# Patient Record
Sex: Male | Born: 1980 | Race: White | Hispanic: No | Marital: Married | Smoking: Current every day smoker
Health system: Southern US, Community
[De-identification: ages and names within clinical notes are randomized; demographics above are authoritative.]

## PROBLEM LIST (undated history)

## (undated) DIAGNOSIS — J449 Chronic obstructive pulmonary disease, unspecified: Secondary | ICD-10-CM

## (undated) DIAGNOSIS — K3184 Gastroparesis: Secondary | ICD-10-CM

## (undated) DIAGNOSIS — I10 Essential (primary) hypertension: Secondary | ICD-10-CM

## (undated) DIAGNOSIS — E119 Type 2 diabetes mellitus without complications: Secondary | ICD-10-CM

---

## 2007-11-09 ENCOUNTER — Emergency Department (HOSPITAL_COMMUNITY): Admission: EM | Admit: 2007-11-09 | Discharge: 2007-11-09 | Payer: Self-pay | Admitting: Emergency Medicine

## 2008-07-03 ENCOUNTER — Emergency Department (HOSPITAL_COMMUNITY): Admission: EM | Admit: 2008-07-03 | Discharge: 2008-07-03 | Payer: Self-pay | Admitting: Emergency Medicine

## 2008-08-14 ENCOUNTER — Inpatient Hospital Stay (HOSPITAL_COMMUNITY): Admission: EM | Admit: 2008-08-14 | Discharge: 2008-08-17 | Payer: Self-pay | Admitting: Emergency Medicine

## 2011-03-11 NOTE — Consult Note (Signed)
Marco Beasley, [place] NO.:  [id number]   MEDICAL RECORD NO.:  [id number]          PATIENT TYPE:  INP   LOCATION:  5501                         FACILITY:  [facility]   PHYSICIAN:  Talmage Coin, M.D.     DATE OF BIRTH:  [birth year]   DATE OF CONSULTATION:  08/16/2008  DATE OF DISCHARGE:                                 CONSULTATION   CHIEF COMPLAINT:  Fluctuating blood sugar.   HISTORY OF PRESENT ILLNESS:  Mr. Marco Beasley is a 30-year-of-age gentleman  with type 1 diabetes mellitus since age 30.  He notes that his mother  provided support.  He realizes that she provided more support than he  ever imagined.  Unfortunately, she died 4 years ago.  He struggled to  maintain glycemic control for the past 3 years.  His complications  included distal peripheral neuropathy, erectile dysfunction, but unknown  burden of retinopathy.  He is motivated to control his diabetes now for  the sake of a functional life and for the sake of his 3 sons.  At home,  he has been taking Lantus 45 units subcutaneous at bedtime.  He had also  been on either NovoLog or actually using Humalog Mix 75/25 at meal  times.  He reports of carb ratio of 1 unit per 7 g, but subtracting 2  units for meal times.  No correction boluses.  He notes blood glucose  values that are often low around 3 a.m. at least 3 times per week on the  current regimen.  He notes that his labile glycemic control does not  have any clear precipitating causes at times.  He feels that 6 units for  60 g at meal times is usually sufficient if his blood glucose is  appropriate before the meal.  However, if he has an elevated blood  glucose with 6 units per 60 g, is insufficient.   PAST MEDICAL HISTORY:  Type 1 diabetes mellitus, erectile dysfunction,  and diabetic distal peripheral neuropathy.   ALLERGIES:  No known drug allergies.   MEDICATIONS:  Medication list reviewed in detail.   SOCIAL HISTORY:  Mother died 4 years ago.  The  patient uses tobacco.   FAMILY MEDICAL HISTORY:  Type 1 diabetes mellitus.   REVIEW OF SYSTEMS:  The patient denies recent weight loss or weight  gain.  At the time of his diagnosis, weight 245 pounds and had a blood  glucose as high as 1800 mg/dL.  He has experienced diabetic ketoacidosis  in the past.  He has maintained a stable weight of 195 pounds recently.  He denies nausea, vomiting, constipation, or diarrhea.  He has no  chronic numbness and tingling in a stocking and glove distribution.  He  denies acute vision change.  He reports hypoglycemic awareness when  blood glucose is below 60 mg/dL.  He denies foot ulcers, rashes or skin  lesions.   PHYSICAL EXAMINATION:  VITAL SIGNS:  Temperature 97.1, blood pressure  103/63, pulse 56 and regular, respiratory 20, weight 65.3 kilograms, and  height 68 inches.  GENERAL:  In no apparent distress.  Well-developed and well-nourished  gentleman.  EYES:  Extraocular movements intact and anicteric.  LYMPH:  No cervical adenopathy.  NECK:  Thyroid mobile, symmetric, and nontender.  No thyromegaly or  discrete nodule.  RESPIRATORY:  Clear, symmetric, and unlabored.  No pallor or cyanosis.  CARDIOVASCULAR:  Regular rhythm and rate.  Symmetric pedal pulses.  No  edema.  GASTROINTESTINAL:  Active bowel sounds.  Soft, nontender, and  nondistended.  No abnormal masses or hepatomegaly.  SKIN:  Appropriately warm and dry.  No jaundice, foot ulcers, or  lacerations between the toe.  He has a healing wound on the dorsum of  his foot.  No evidence of infection, however.  MUSCULOSKELETAL:  No atrophy or deformity.  He does have lipohypertrophy  on the abdominal wall at insulin injection site.  No lipohypertrophy in  the injection sites on the arms.  NEUROLOGIC:  Diminished sensation in the feet.  No tremor.   LABORATORY DATA:  On August 13, 2008, urinalysis negative for ketones,  pH 7.38, and PCO2 50.  Sodium 131, potassium 5.0, bicarbonate 27,   chloride 96, BUN 13, creatinine 1.0, glucose 618, and calcium 9.2.  On  August 15, 2008, sodium 140, potassium 4.0, bicarbonate 27, chloride  109, BUN 9, creatinine 0.7, glucose 328, and calcium 8.6.  White blood  count 5.1, hemoglobin 12.5, MCV 94, platelets 134, and hemoglobin A1c  8.9% estimating a mean glucose of 209.  TSH 1.24 microunits per  milliliter on August 14, 2008.  Glycemic records reviewed in detail.  At supper time on August 14, 2008, he received NovoLog 10 units when  glucose 129, subsequently the glucose was low at 64 mg/dL.  When glucose  27 mg/dL at bedtime, he received Lantus 40 units.  Overnight, his blood  glucose raised to 231 by records.  He received NovoLog 8 units, but then  his blood glucose declined to 46 mg/dL by lunch time.  On August 15, 2008, at supper time, glucose 242.  He received NovoLog 6 units and  remained elevated at 266 mg/dL at bedtime, he received NovoLog 3 units  and Lantus 40 units.  His blood glucose declined overnight to 79 mg/dL  upon awakening and actually as low as 51 mg/dL for breakfast.  Today at  lunch time, blood glucose had raised again to 141 mg/dL, and he received  NovoLog 3 units at lunch time.   ASSESSMENT AND PLAN:  1. Type 1 diabetes mellitus, uncontrolled.  2. Diabetic distal peripheral neuropathy.  3. Tobacco use.   Smoking cessation advised.  The patient has type 1 diabetes mellitus for  the past 14 years.  He reports better glycemic control when he had  support from his mother.  Unfortunately, his mother has subsequently  died.  As many young adults, he struggles to maintain appropriate  glycemic control without the support of family.  At present, his Lantus  dose is rather generous resulting in overnight hypoglycemia as evidence  when he also had a NovoLog dose at bedtime for correction of  hypoglycemia.  Generally speaking, most patients need 50% of insulin as  basal such as Lantus or Levemir and 50% as bolus such  as NovoLog,  Humalog, or Apidra.  The patient is informed that Humalog Mix 75/25  provides only 25% rapid acting insulin, 75% intermediate-acting insulin.  This will not be an appropriate mealtime bolus when he is taking Lantus  insulin once a day.  Therefore, I encouraged him to resume using his  NovoLog at home and  his Lantus.  Based upon his report, it seems that  one unit of rapid-acting insulin is appropriate for 10 g of  carbohydrates at meal times.  However, I would not recommend mealtime  bolus when he takes a bedtime snack.  This might result in overnight  hypoglycemia.  We reviewed how NovoLog should be taken at beginning of  the meal rather than 30 minutes before or after the meal.  Also we had  estimated that his NovoLog requirements for every 50 mg/dL above the  target of 161 mg/dL.  It is difficult to determine the Lantus dose at  this time.  It may be reasonable to start the Lantus 30 units at  bedtime, which is 33% reduction from his current home dose.  Based upon  his mealtime requirements, he may need even less than 30 units of Lantus  at bedtime.  Another option would be to take the Lantus in the morning,  so that if he does have a peak in Lantus a few hours after taking the  dose, it will occur during the day when he can better monitor and manage  hypoglycemia rather than overnight.  He may also benefit from splitting  the Lantus with a twice daily dosing such as 10-15 units twice a day  rather than 20-30 units at one time.  I have discussed the case with the  attending hospitalist.  Her plan is to keep the patient at least  overnight  for further monitoring and adjustments of his insulin regimen.  I will  follow along in hospital stay.  Also asked the patient to schedule a  visit at [facility] on Friday August 18, 2008,  for an outpatient visit with me.      Talmage Coin, M.D.  Electronically Signed     JK/MEDQ  D:  08/16/2008  T:   08/17/2008  Job:  [id number]

## 2011-03-11 NOTE — Discharge Summary (Signed)
Marco Beasley, Marco Beasley            ACCOUNT NO.:  [id number]   MEDICAL RECORD NO.:  [id number]          PATIENT TYPE:  INP   LOCATION:  5501                         FACILITY:  [facility]   PHYSICIAN:  Theodosia Paling, MD    DATE OF BIRTH:  03-Jun-1981   DATE OF ADMISSION:  08/13/2008  DATE OF DISCHARGE:  08/17/2008                               DISCHARGE SUMMARY   PRIMARY CARE PHYSICIAN:  The patient does not have a PCP at this time.   ADMITTING HISTORY:  Please refer to Dr. Shon Baton note dated August 13, 2008, under the history of present illness.   DISCHARGE DIAGNOSES:  1. Severe hyperglycemia secondary to diabetes mellitus.  2. Nitrofurantoin.  3. Hypertension.  4. Diabetic neuropathy.   DISCHARGE MEDICATIONS:  1. Aspirin enteric-coated 81 mg p.o. daily.  2. Omeprazole 20 mg p.o. q.12 h.  3. Neurontin 300 mg p.o. nightly.  4. Lantus insulin 20 units subcu nightly.  5. NovoLog 1 unit per 10 g of carbohydrate at meal time, but not at      bedtime.  6. NovoLog 1 unit per 50 g/dL of blood glucose above 100 mg/dL at meal      time, but not at bedtime.  7. Glucagon Emergency Kit as needed per instructions.  8. Oxycodone 5 mg p.o. q.4 h. p.r.n. for neuropathic pain.   HOSPITAL COURSE:  Following issues were addressed during the  hospitalization.  1. Hyperglycemia.  The patient received insulin. The patient      demonstrated very labile blood glucose control, therefore,      consultation with Endocrinology consult was obtained.  The patient      will be following up with him within 1 week's time and will call      him in case he has any questions meanwhile.  At the time of      discharge, he is relatively euglycemic without any incidence of      hypoglycemia for the last 20 hours.  The patient will be using      Lantus 20 units subcu nightly as a long-acting insulin, and he will      be using NovoLog 1 unit/10 g of carbohydrate at meal time and 1      unit per 50 mg/dL of blood  glucose above 100 at meal time, but not      at bedtime.  2. Diabetic neuropathy.  The patient was requesting for IV Dilaudid      for pain control.  At the time of discharge, he was going on p.o.      oxycodone, he says he gets leg swelling with Lyrica, labile mood      with Cymbalta.  He will be following up with Dr. Sharl Ma for diabetic      neuropathy management as well.  3. Hypertension.  The patient at the time of presentation was      hypertensive, however, without any antihypertensive, blood pressure      normalized to 103/63 at the time of discharge.  4. Retinopathy.  The patient will follow up as an outpatient with the  ophthalmologist.   DISPOSITION:  The patient will follow up with Dr. Sharl Ma in his office  within 1 week's time.   Total time spent in discharge of this patient 45 minutes.   Consultation obtained Dr. Sharl Ma from [facility].   PROCEDURE PERFORMED:  None.   IMAGING PERFORMED:  None.      Theodosia Paling, MD  Electronically Signed     NP/MEDQ  D:  08/17/2008  T:  08/18/2008  Job:  [id number]

## 2011-03-11 NOTE — H&P (Signed)
NAME:  Marco Beasley, Marco Beasley            ACCOUNT NO.:  [id number]   MEDICAL RECORD NO.:  [id number]          PATIENT TYPE:  INP   LOCATION:  1832                         FACILITY:  [facility]   PHYSICIAN:  Lonia Blood, M.D.      DATE OF BIRTH:  05-15-1981   DATE OF ADMISSION:  08/13/2008  DATE OF DISCHARGE:                              HISTORY & PHYSICAL   PRIMARY CARE PHYSICIAN:  The patient is unassigned.   PRESENTING COMPLAINT:  High blood sugar.   HISTORY OF PRESENT ILLNESS:  The patient is a 30 year old type 1  diabetic, who has been following with his physician at [facility]  all his life.  The patient has had problems with his blood sugar  especially in the past 3 years.  His sugar has been consistently at  least 250.  He has had multiple admissions at [facility] for  sugar control.  Per patient, the last admission was last week, when he  was seen and told that he needed to come to [facility] because there is  no endocrinologist at the [facility].  The patient apparently has had  multiple DKAs in the past.  He has had problem with subcutaneous  insulin.  He always get his sugar dropped to at least 200 in the  hospital and sent home, but his Lantus and Humalog have not been able to  control his sugars at home.  He has had significant neuropathy and  retinopathy and continuous pain.   PAST MEDICAL HISTORY:  1. Diabetes type 1.  2. Peripheral neuropathy.  3. Diabetic retinopathy.   ALLERGIES:  HYDROCODONE, LEVAQUIN, and MORPHINE.   MEDICATIONS:  1. Lantus 45 units at night.  2. Humalog 75/25.  3. NovoLog sliding scale insulin.  4. Neurontin.  5. Protonix.   SOCIAL HISTORY:  The patient is married, lives in [place].  He smokes  about half a pack per day.  He is under tremendous stress due to custody  battle.  He has had a history of narcotic abuse in the past.  He denied  any alcohol use.   FAMILY HISTORY:  Mainly diabetes.   REVIEW OF SYSTEMS:  A 12-point review  of systems is negative except per  HPI.  The patient mainly complains of generalized pain from his  neuropathy.   PHYSICAL EXAMINATION:  VITAL SIGNS:  Temperature is 98, blood pressure  154/75, his pulse is 72, respiratory rate 19, and sats 98% on room air.  GENERAL:  The patient looks chronically ill-looking, but in no acute  distress.  HEENT:  PERRL.  EOMI.  NECK:  Supple.  No JVD.  No lymphadenopathy.  RESPIRATORY:  He has good air entry bilaterally.  No wheezes or rales.  CARDIOVASCULAR:  The patient has S1 and S2.  No murmurs.  ABDOMEN:  Soft and nontender with positive bowel sounds.  EXTREMITIES:  No edema, cyanosis, or clubbing.   LABORATORY DATA:  White count is 5.8, hemoglobin 14.6, platelet count  167.  Urinalysis negative except for glycosuria.  Sodium is 131,  potassium 5.0, chloride 96, BUN 13, creatinine 1.0, glucose 618, and  ionized calcium 1.15.  Urine drug screen is negative.   ASSESSMENT:  This is a 30 year old gentleman presenting with severe  hyperglycemia.  The patient has had multiple admissions.  His sugar is  not controlled at home.  He will probably benefit from further admission  and Endocrinology followup.   PLAN:  1. Severe hyperglycemia.  We will admit him to step-down unit.  Start      IV insulin and normal saline.  Continue to adjust his insulin as      necessary.  Once his sugars stabilized around 250, we will get the      patient back on subcutaneous insulin and titrate it as necessary.  2. Peripheral neuropathy.  We will continue with his Neurontin and      some pain control.  3. Hypertension.  The patient is not on any anti-hypertensive at home.      We will endeavor to give him that while in the hospital.  4. Diabetic retinopathy.  Again, this is new for the patient and he      has an ophthalmologist that he follows up with at home.  Otherwise      further treatment will depend on how he does in hospital.      Lonia Blood, M.D.   Electronically Signed     LG/MEDQ  D:  08/14/2008  T:  08/14/2008  Job:  [id number]

## 2011-07-17 LAB — I-STAT 8, (EC8 V) (CONVERTED LAB)
BUN: 10
Bicarbonate: 31 — ABNORMAL HIGH
Hemoglobin: 15.3
Operator id: 198171
pCO2, Ven: 58.2 — ABNORMAL HIGH
pH, Ven: 7.335 — ABNORMAL HIGH

## 2011-07-17 LAB — DIFFERENTIAL
Basophils Absolute: 0
Lymphocytes Relative: 17
Monocytes Absolute: 0.5
Monocytes Relative: 6
Neutro Abs: 6.3

## 2011-07-17 LAB — BASIC METABOLIC PANEL
Calcium: 9.1
GFR calc Af Amer: >60
GFR calc non Af Amer: >60
Sodium: 141

## 2011-07-17 LAB — CBC
Hemoglobin: 14.6
RBC: 4.61

## 2011-07-17 LAB — RAPID URINE DRUG SCREEN, HOSP PERFORMED
Benzodiazepines: NOT DETECTED
Cocaine: NOT DETECTED
Opiates: POSITIVE — AB
Tetrahydrocannabinol: POSITIVE — AB

## 2011-07-17 LAB — ETHANOL: Alcohol, Ethyl (B): <5

## 2011-07-28 LAB — POCT I-STAT, CHEM 8
Glucose, Bld: 618
HCT: 42
Hemoglobin: 14.3
Potassium: 5

## 2011-07-28 LAB — URINALYSIS, ROUTINE W REFLEX MICROSCOPIC
Bilirubin Urine: NEGATIVE
Hgb urine dipstick: NEGATIVE
Protein, ur: NEGATIVE
Urobilinogen, UA: 0.2

## 2011-07-28 LAB — CBC
HCT: 39.5
HCT: 43.5
Hemoglobin: 12.5 — ABNORMAL LOW
Hemoglobin: 13.4
Hemoglobin: 14.6
MCHC: 34
MCHC: 34
Platelets: 167
Platelets: 169
RBC: 3.89 — ABNORMAL LOW
RBC: 4.53
RDW: 12.8
WBC: 5.1
WBC: 5.8

## 2011-07-28 LAB — GLUCOSE, CAPILLARY
Glucose-Capillary: 104 — ABNORMAL HIGH
Glucose-Capillary: 114 — ABNORMAL HIGH
Glucose-Capillary: 116 — ABNORMAL HIGH
Glucose-Capillary: 120 — ABNORMAL HIGH
Glucose-Capillary: 122 — ABNORMAL HIGH
Glucose-Capillary: 137 — ABNORMAL HIGH
Glucose-Capillary: 152 — ABNORMAL HIGH
Glucose-Capillary: 169 — ABNORMAL HIGH
Glucose-Capillary: 242 — ABNORMAL HIGH
Glucose-Capillary: 266 — ABNORMAL HIGH
Glucose-Capillary: 323 — ABNORMAL HIGH
Glucose-Capillary: 46 — ABNORMAL LOW
Glucose-Capillary: 497 — ABNORMAL HIGH
Glucose-Capillary: 51 — ABNORMAL LOW
Glucose-Capillary: 54 — ABNORMAL LOW
Glucose-Capillary: 56 — ABNORMAL LOW
Glucose-Capillary: 79
Glucose-Capillary: 85
Glucose-Capillary: 85

## 2011-07-28 LAB — BASIC METABOLIC PANEL
BUN: 12
CO2: 27
CO2: 34 — ABNORMAL HIGH
Calcium: 8.6
Calcium: 9.1
GFR calc Af Amer: >60
GFR calc Af Amer: >60
GFR calc non Af Amer: >60
GFR calc non Af Amer: >60
GFR calc non Af Amer: >60
Glucose, Bld: 122 — ABNORMAL HIGH
Potassium: 4.9
Sodium: 128 — ABNORMAL LOW
Sodium: 140

## 2011-07-28 LAB — POCT I-STAT 3, VENOUS BLOOD GAS (G3P V)
Acid-Base Excess: 4 — ABNORMAL HIGH
pO2, Ven: 29 — CL

## 2011-07-28 LAB — DIFFERENTIAL
Eosinophils Relative: 4
Lymphocytes Relative: 26
Lymphs Abs: 1.5
Monocytes Absolute: 0.3
Monocytes Relative: 6

## 2011-07-28 LAB — COMPREHENSIVE METABOLIC PANEL
Albumin: 3.9
BUN: 11
Calcium: 8.9
Creatinine, Ser: 0.75
Potassium: 3.8
Total Protein: 6

## 2011-07-28 LAB — RAPID URINE DRUG SCREEN, HOSP PERFORMED
Amphetamines: NOT DETECTED
Barbiturates: NOT DETECTED
Benzodiazepines: NOT DETECTED

## 2011-07-28 LAB — MAGNESIUM: Magnesium: 2

## 2011-07-28 LAB — HEMOGLOBIN A1C
Mean Plasma Glucose: 203
Mean Plasma Glucose: 209

## 2011-07-28 LAB — CULTURE, BLOOD (ROUTINE X 2)

## 2011-07-28 LAB — CALCIUM: Calcium: 8.3 — ABNORMAL LOW

## 2011-07-28 LAB — LIPID PANEL
HDL: 34 — ABNORMAL LOW
VLDL: 9

## 2011-07-28 LAB — TSH: TSH: 1.246

## 2015-08-07 ENCOUNTER — Emergency Department (HOSPITAL_COMMUNITY): Payer: Self-pay

## 2015-08-07 ENCOUNTER — Inpatient Hospital Stay (HOSPITAL_COMMUNITY)
Admission: EM | Admit: 2015-08-07 | Discharge: 2015-08-11 | DRG: 638 | Disposition: A | Discharge status: Home / Self Care | Payer: Self-pay | Attending: Internal Medicine | Admitting: Internal Medicine

## 2015-08-07 ENCOUNTER — Encounter (HOSPITAL_COMMUNITY): Payer: Self-pay | Admitting: Emergency Medicine

## 2015-08-07 DIAGNOSIS — E1043 Type 1 diabetes mellitus with diabetic autonomic (poly)neuropathy: Secondary | ICD-10-CM | POA: Diagnosis present

## 2015-08-07 DIAGNOSIS — E871 Hypo-osmolality and hyponatremia: Secondary | ICD-10-CM | POA: Diagnosis present

## 2015-08-07 DIAGNOSIS — E860 Dehydration: Secondary | ICD-10-CM | POA: Diagnosis present

## 2015-08-07 DIAGNOSIS — R7401 Elevation of levels of liver transaminase levels: Secondary | ICD-10-CM

## 2015-08-07 DIAGNOSIS — Z880 Allergy status to penicillin: Secondary | ICD-10-CM

## 2015-08-07 DIAGNOSIS — Z22322 Carrier or suspected carrier of Methicillin resistant Staphylococcus aureus: Secondary | ICD-10-CM

## 2015-08-07 DIAGNOSIS — R740 Nonspecific elevation of levels of transaminase and lactic acid dehydrogenase [LDH]: Secondary | ICD-10-CM | POA: Diagnosis present

## 2015-08-07 DIAGNOSIS — R748 Abnormal levels of other serum enzymes: Secondary | ICD-10-CM | POA: Insufficient documentation

## 2015-08-07 DIAGNOSIS — J449 Chronic obstructive pulmonary disease, unspecified: Secondary | ICD-10-CM | POA: Diagnosis present

## 2015-08-07 DIAGNOSIS — K3184 Gastroparesis: Secondary | ICD-10-CM | POA: Diagnosis present

## 2015-08-07 DIAGNOSIS — Z833 Family history of diabetes mellitus: Secondary | ICD-10-CM

## 2015-08-07 DIAGNOSIS — K838 Other specified diseases of biliary tract: Secondary | ICD-10-CM

## 2015-08-07 DIAGNOSIS — F17200 Nicotine dependence, unspecified, uncomplicated: Secondary | ICD-10-CM | POA: Diagnosis present

## 2015-08-07 DIAGNOSIS — E1010 Type 1 diabetes mellitus with ketoacidosis without coma: Principal | ICD-10-CM | POA: Diagnosis present

## 2015-08-07 DIAGNOSIS — E1110 Type 2 diabetes mellitus with ketoacidosis without coma: Secondary | ICD-10-CM | POA: Diagnosis present

## 2015-08-07 DIAGNOSIS — I10 Essential (primary) hypertension: Secondary | ICD-10-CM | POA: Diagnosis present

## 2015-08-07 DIAGNOSIS — N179 Acute kidney failure, unspecified: Secondary | ICD-10-CM | POA: Diagnosis present

## 2015-08-07 HISTORY — DX: Essential (primary) hypertension: I10

## 2015-08-07 HISTORY — DX: Gastroparesis: K31.84

## 2015-08-07 HISTORY — DX: Chronic obstructive pulmonary disease, unspecified: J44.9

## 2015-08-07 HISTORY — DX: Type 2 diabetes mellitus without complications: E11.9

## 2015-08-07 LAB — URINALYSIS, ROUTINE W REFLEX MICROSCOPIC
Bilirubin Urine: NEGATIVE
Glucose, UA: >1000 mg/dL — AB
Hgb urine dipstick: NEGATIVE
KETONES UR: 40 mg/dL — AB
LEUKOCYTES UA: NEGATIVE
NITRITE: NEGATIVE
PH: 6.5 (ref 5.0–8.0)
Protein, ur: NEGATIVE mg/dL
Specific Gravity, Urine: 1.021 (ref 1.005–1.030)
Urobilinogen, UA: 0.2 mg/dL (ref 0.0–1.0)

## 2015-08-07 LAB — CBC
HCT: 41.3 % (ref 39.0–52.0)
Hemoglobin: 13.9 g/dL (ref 13.0–17.0)
MCH: 31.2 pg (ref 26.0–34.0)
MCHC: 33.7 g/dL (ref 30.0–36.0)
MCV: 92.8 fL (ref 78.0–100.0)
PLATELETS: 177 10*3/uL (ref 150–400)
RBC: 4.45 MIL/uL (ref 4.22–5.81)
RDW: 12.3 % (ref 11.5–15.5)
WBC: 4.4 10*3/uL (ref 4.0–10.5)

## 2015-08-07 LAB — I-STAT TROPONIN, ED: TROPONIN I, POC: 0 ng/mL (ref 0.00–0.08)

## 2015-08-07 LAB — URINE MICROSCOPIC-ADD ON

## 2015-08-07 LAB — COMPREHENSIVE METABOLIC PANEL
ALT: 105 U/L — AB (ref 17–63)
AST: 158 U/L — AB (ref 15–41)
Albumin: 3.3 g/dL — ABNORMAL LOW (ref 3.5–5.0)
Alkaline Phosphatase: 214 U/L — ABNORMAL HIGH (ref 38–126)
Anion gap: 17 — ABNORMAL HIGH (ref 5–15)
BUN: 17 mg/dL (ref 6–20)
CHLORIDE: 90 mmol/L — AB (ref 101–111)
CO2: 17 mmol/L — AB (ref 22–32)
CREATININE: 1.32 mg/dL — AB (ref 0.61–1.24)
Calcium: 8.6 mg/dL — ABNORMAL LOW (ref 8.9–10.3)
GFR calc Af Amer: >60 mL/min (ref >60)
GLUCOSE: 915 mg/dL — AB (ref 65–99)
Potassium: 5.2 mmol/L — ABNORMAL HIGH (ref 3.5–5.1)
SODIUM: 124 mmol/L — AB (ref 135–145)
Total Bilirubin: 1.4 mg/dL — ABNORMAL HIGH (ref 0.3–1.2)
Total Protein: 6.5 g/dL (ref 6.5–8.1)

## 2015-08-07 LAB — CBG MONITORING, ED
GLUCOSE-CAPILLARY: 562 mg/dL — AB (ref 65–99)
Glucose-Capillary: >600 mg/dL (ref 65–99)
Glucose-Capillary: 395 mg/dL — ABNORMAL HIGH (ref 65–99)

## 2015-08-07 MED ORDER — ONDANSETRON HCL 4 MG/2ML IJ SOLN
4.0000 mg | Freq: Once | INTRAMUSCULAR | Status: AC
  Administered 2015-08-07: 4 mg via INTRAVENOUS
  Filled 2015-08-07: qty 2

## 2015-08-07 MED ORDER — ONDANSETRON HCL 4 MG/2ML IJ SOLN
4.0000 mg | Freq: Four times a day (QID) | INTRAMUSCULAR | Status: DC | PRN
  Administered 2015-08-09: 4 mg via INTRAVENOUS
  Filled 2015-08-07: qty 2

## 2015-08-07 MED ORDER — ONDANSETRON 4 MG PO TBDP
4.0000 mg | ORAL_TABLET | Freq: Once | ORAL | Status: AC | PRN
  Administered 2015-08-07: 4 mg via ORAL

## 2015-08-07 MED ORDER — ENOXAPARIN SODIUM 40 MG/0.4ML ~~LOC~~ SOLN
40.0000 mg | SUBCUTANEOUS | Status: DC
  Administered 2015-08-08 – 2015-08-11 (×4): 40 mg via SUBCUTANEOUS
  Filled 2015-08-07 (×4): qty 0.4

## 2015-08-07 MED ORDER — SODIUM CHLORIDE 0.9 % IV BOLUS (SEPSIS)
1000.0000 mL | Freq: Once | INTRAVENOUS | Status: AC
  Administered 2015-08-07: 1000 mL via INTRAVENOUS

## 2015-08-07 MED ORDER — PANTOPRAZOLE SODIUM 40 MG IV SOLR
40.0000 mg | INTRAVENOUS | Status: DC
Start: 2015-08-07 — End: 2015-08-08
  Administered 2015-08-07: 40 mg via INTRAVENOUS
  Filled 2015-08-07: qty 40

## 2015-08-07 MED ORDER — SODIUM CHLORIDE 0.9 % IV SOLN
INTRAVENOUS | Status: AC
  Administered 2015-08-07: via INTRAVENOUS

## 2015-08-07 MED ORDER — FENTANYL CITRATE (PF) 100 MCG/2ML IJ SOLN
25.0000 ug | Freq: Once | INTRAMUSCULAR | Status: AC
  Administered 2015-08-07: 25 ug via INTRAVENOUS
  Filled 2015-08-07: qty 2

## 2015-08-07 MED ORDER — ONDANSETRON 4 MG PO TBDP
ORAL_TABLET | ORAL | Status: AC
  Filled 2015-08-07: qty 1

## 2015-08-07 MED ORDER — DEXTROSE-NACL 5-0.45 % IV SOLN
INTRAVENOUS | Status: DC
  Administered 2015-08-07: 01:00:00 via INTRAVENOUS

## 2015-08-07 MED ORDER — MORPHINE SULFATE (PF) 2 MG/ML IV SOLN
2.0000 mg | INTRAVENOUS | Status: DC | PRN
  Administered 2015-08-07 – 2015-08-11 (×16): 2 mg via INTRAVENOUS
  Filled 2015-08-07 (×16): qty 1

## 2015-08-07 MED ORDER — SODIUM CHLORIDE 0.9 % IV SOLN
INTRAVENOUS | Status: DC
  Administered 2015-08-07: 5.4 [IU]/h via INTRAVENOUS
  Filled 2015-08-07 (×2): qty 2.5

## 2015-08-07 MED ORDER — SODIUM CHLORIDE 0.9 % IV SOLN
1000.0000 mL | INTRAVENOUS | Status: DC
  Administered 2015-08-07: 1000 mL via INTRAVENOUS

## 2015-08-07 NOTE — ED Notes (Signed)
CHECKED CBG IT READS HI RN JESSICA INFORMED

## 2015-08-07 NOTE — ED Notes (Addendum)
CRITICAL VALUE ALERT  Critical value received: Glucose level 915

## 2015-08-07 NOTE — ED Provider Notes (Signed)
CSN: [id number]     Arrival date & time 08/07/15  1802 History   First MD Initiated Contact with Patient 08/07/15 1858     Chief Complaint  Patient presents with  . Hyperglycemia  . Chest Pain     (Consider location/radiation/quality/duration/timing/severity/associated sxs/prior Treatment) HPI  Patient presents with nausea, vomiting, abdominal pain, generalized discomfort. Symptoms began about 3 days ago. Patient believes he has a virus, as there are multiple sick members in his household. Since onset no relief with anything, and no relief with taking regular insulin dosing. Patient denies confusion, dislocation, fever.   Past Medical History  Diagnosis Date  . Diabetes mellitus without complication (HCC)   . Hypertension   . COPD (chronic obstructive pulmonary disease) (HCC)   . Gastroparesis    History reviewed. No pertinent past surgical history. History reviewed. No pertinent family history. Social History  Substance Use Topics  . Smoking status: Current Every Day Smoker  . Smokeless tobacco: None  . Alcohol Use: No    Review of Systems  Constitutional:       Per HPI, otherwise negative  HENT:       Per HPI, otherwise negative  Respiratory:       Per HPI, otherwise negative  Cardiovascular:       Per HPI, otherwise negative  Gastrointestinal: Positive for nausea and vomiting.  Endocrine:       Negative aside from HPI  Genitourinary:       Neg aside from HPI   Musculoskeletal:       Per HPI, otherwise negative  Skin: Negative.   Neurological: Positive for weakness. Negative for syncope.      Allergies  Penicillins  Home Medications   Prior to Admission medications   Not on File   BP 131/72 mmHg  Pulse 110  Temp(Src) 98.3 F (36.8 C) (Oral)  Resp 18  SpO2 99% Physical Exam  Constitutional: He is oriented to person, place, and time. He appears well-developed. No distress.  HENT:  Head: Normocephalic and atraumatic.  Eyes: Conjunctivae and  EOM are normal.  Cardiovascular: Regular rhythm.  Tachycardia present.   Pulmonary/Chest: Effort normal. No stridor. No respiratory distress.  Abdominal: He exhibits no distension.  Mild diffuse ttp, no guarding  Musculoskeletal: He exhibits no edema.  Neurological: He is alert and oriented to person, place, and time.  Skin: Skin is warm and dry.  Psychiatric: He has a normal mood and affect.  Nursing note and vitals reviewed.   ED Course  Procedures (including critical care time) Labs Review Labs Reviewed  COMPREHENSIVE METABOLIC PANEL - Abnormal; Notable for the following:    Sodium 124 (*)    Potassium 5.2 (*)    Chloride 90 (*)    CO2 17 (*)    Glucose, Bld 915 (*)    Creatinine, Ser 1.32 (*)    Calcium 8.6 (*)    Albumin 3.3 (*)    AST 158 (*)    ALT 105 (*)    Alkaline Phosphatase 214 (*)    Total Bilirubin 1.4 (*)    Anion gap 17 (*)    All other components within normal limits  CBG MONITORING, ED - Abnormal; Notable for the following:    Glucose-Capillary >600 (*)    All other components within normal limits  CBC  CBG MONITORING, ED  Rosezena Sensor, ED    Imaging Review Dg Chest 2 View  08/07/2015   CLINICAL DATA:  Left-sided chest pain, shortness of breath for  1 week  EXAM: CHEST  2 VIEW  COMPARISON:  08/06/2015  FINDINGS: The heart size and mediastinal contours are within normal limits. Both lungs are clear. The visualized skeletal structures are unremarkable.  IMPRESSION: No active cardiopulmonary disease.   Electronically Signed   By: Elige Ko   On: 08/07/2015 18:42   I have personally reviewed and evaluated these images and lab results as part of my medical decision-making.  POC glucose >600.  NS started empirically   Update: labs c/w DKA, BG 915 AG 17   9:11 PM  insulin now running at 5.4 / hr.  2L NS complete, IVF running. MDM  Young male with a history of insulin-dependent diabetes presents with several days nausea, vomiting, abdominal  pain. The patient has mild tenderness throughout his abdomen, but is non-peritoneal. Patient is afebrile, mentating well, but initial labs are notable for hyperglycemia, with an anion gap, consistent with diabetic ketoacidosis. After the patient had initial fluid resuscitation with normal saline, patient also was started on an insulin drip. Patient tolerated this well, but given his illness, required admission to the step down unit for further evaluation and management.  CRITICAL CARE Performed by: Gerhard Munch Total critical care time: 35 Critical care time was exclusive of separately billable procedures and treating other patients. Critical care was necessary to treat or prevent imminent or life-threatening deterioration. Critical care was time spent personally by me on the following activities: development of treatment plan with patient and/or surrogate as well as nursing, discussions with consultants, evaluation of patient's response to treatment, examination of patient, obtaining history from patient or surrogate, ordering and performing treatments and interventions, ordering and review of laboratory studies, ordering and review of radiographic studies, pulse oximetry and re-evaluation of patient's condition.   Gerhard Munch, MD 08/07/15 2112

## 2015-08-07 NOTE — H&P (Signed)
History and Physical  Marco Beasley:[id number] DOB: 09/11/1981 DOA: 08/07/2015  PCP: PROVIDER NOT IN SYSTEM   Chief Complaint: vomiting and abdominal pain  History of Present Illness:  Patient is a 34 year old male with history of DM I who is here with cc of nausea, vomiting, lower abdominal pain for the last 3-4 days associated with dyspnea. He has had mild fever and occasional chills. No diarrhea or constipation. No cough, dysuria, skin infections or chest pain. He has been compliant with his medications. He was admitted for DKA last month as he ran out of insulin due to loss of insurance but he has insurance/medication now.   Review of Systems:  CONSTITUTIONAL:  No night sweats.  No fatigue, malaise, lethargy.  + fever + chills. Eyes:  No visual changes.  No eye pain.  No eye discharge.   ENT:    No epistaxis.  No sinus pain.  No sore throat.  No ear pain.  No congestion. RESPIRATORY:  No cough.  No wheeze.  No hemoptysis.  + shortness of breath. CARDIOVASCULAR:  No chest pains.  No palpitations. GASTROINTESTINAL:  + abdominal pain.  + nausea + vomiting.  No diarrhea or constipation.  No hematemesis.  No hematochezia.  No melena. GENITOURINARY:  No urgency.  No frequency.  No dysuria.  No hematuria.  No obstructive symptoms.  No discharge.  No pain.  No significant abnormal bleeding. MUSCULOSKELETAL:  No musculoskeletal pain.  No joint swelling.  No arthritis. NEUROLOGICAL:  No confusion.  No weakness. No headache. No seizure. PSYCHIATRIC:  No depression. No anxiety. No suicidal ideation. SKIN:  No rashes.  No lesions.  No wounds. ENDOCRINE:  No unexplained weight loss.  No polydipsia.  No polyuria.  No polyphagia. HEMATOLOGIC:  No anemia.  No purpura.  No petechiae.  No bleeding.  ALLERGIC AND IMMUNOLOGIC:  No pruritus.  No swelling Other:  Past Medical and Surgical History:   Past Medical History  Diagnosis Date  . Diabetes mellitus without complication (HCC)    . Hypertension   . COPD (chronic obstructive pulmonary disease) (HCC)   . Gastroparesis    History reviewed. No pertinent past surgical history.  Social History:   reports that he has been smoking.  He does not have any smokeless tobacco history on file. He reports that he does not drink alcohol or use illicit drugs.   Allergies  Allergen Reactions  . Penicillins     FH: DM I   Prior to Admission medications   Not on File    Physical Exam: BP 150/80 mmHg  Pulse 110  Temp(Src) 98.3 F (36.8 C) (Oral)  Resp 22  Ht  (1.727 m)  Wt 78.019 kg (172 lb)  BMI 26.16 kg/m2  SpO2 99%  GENERAL : Well developed, well nourished, alert and cooperative, and appears to be in mild acute distress. HEAD: normocephalic. EYES: PERRL, EOMI. EARS: , hearing grossly intact. NOSE: No nasal discharge. THROAT: Oral cavity and pharynx ;dry.  NECK: Neck supple, non-tender. CARDIAC: Normal S1 and S2. No S3, S4 or murmurs. Rhythm is regular. LUNGS: Clear to auscultation  ABDOMEN: Positive bowel sounds. Soft, nondistended, diffuesly tender. No guarding or rebound. No masses. EXTREMITIES: No significant deformity or joint abnormality. No edema. Peripheral pulses intact. No varicosities. NEUROLOGICAL: The mental examination revealed the patient was oriented to person, place, and time.CN II-XII intact. Strength and sensation symmetric and intact throughout.  SKIN: Skin normal color, texture and turgor with no lesions or  eruptions. PSYCHIATRIC:  The patient was able to demonstrate good judgement and reason, without hallucinations, abnormal affect or abnormal behaviors during the examination. Patient is not suicidal.          Labs on Admission:  Reviewed.   Radiological Exams on Admission: Dg Chest 2 View  08/07/2015   CLINICAL DATA:  Left-sided chest pain, shortness of breath for 1 week  EXAM: CHEST  2 VIEW  COMPARISON:  08/06/2015  FINDINGS: The heart size and mediastinal contours are  within normal limits. Both lungs are clear. The visualized skeletal structures are unremarkable.  IMPRESSION: No active cardiopulmonary disease.   Electronically Signed   By: Elige Ko   On: 08/07/2015 18:42    EKG:  Independently reviewed. Sinus   Assessment/Plan  DKA: Possibly due to viral infection  Started on DKA protocol by the ER: insulin drip 5.4 u/hr S/p 2 L of NS, will continue NS then switch to 0.45/D5 when CBG < 250 Check CBG every 1 hour Will check BMP every 6 hours: switch drip to 0.5 u/hr once CBG < 250 then turn off once AG closes UA pending , CXR wo opacity Zofran IV prn, morphine 2 mg IV prn, Protonix IV   AKI : due to dehydration , continue IVF  Transaminates: will check liver US  DVT prophylaxis: enoxaparin [place] GI prophylaxis: PPI IV Code Status: Full code  Eston Esters M.D [facility]

## 2015-08-07 NOTE — ED Notes (Signed)
Pt c/o hyperglycemia and some CP; pt sts recently being treated for virus per pt; pt sts taking meds and is type 1 DM

## 2015-08-08 ENCOUNTER — Inpatient Hospital Stay (HOSPITAL_COMMUNITY): Payer: Self-pay

## 2015-08-08 DIAGNOSIS — R740 Nonspecific elevation of levels of transaminase and lactic acid dehydrogenase [LDH]: Secondary | ICD-10-CM

## 2015-08-08 DIAGNOSIS — N179 Acute kidney failure, unspecified: Secondary | ICD-10-CM

## 2015-08-08 DIAGNOSIS — E871 Hypo-osmolality and hyponatremia: Secondary | ICD-10-CM

## 2015-08-08 DIAGNOSIS — R7401 Elevation of levels of liver transaminase levels: Secondary | ICD-10-CM

## 2015-08-08 DIAGNOSIS — R748 Abnormal levels of other serum enzymes: Secondary | ICD-10-CM

## 2015-08-08 DIAGNOSIS — E1010 Type 1 diabetes mellitus with ketoacidosis without coma: Principal | ICD-10-CM

## 2015-08-08 LAB — BASIC METABOLIC PANEL
ANION GAP: 9 (ref 5–15)
Anion gap: 15 (ref 5–15)
BUN: 14 mg/dL (ref 6–20)
BUN: 12 mg/dL (ref 6–20)
CALCIUM: 8.9 mg/dL (ref 8.9–10.3)
CALCIUM: 8.4 mg/dL — AB (ref 8.9–10.3)
CO2: 20 mmol/L — AB (ref 22–32)
CO2: 24 mmol/L (ref 22–32)
CREATININE: 1.22 mg/dL (ref 0.61–1.24)
Chloride: 100 mmol/L — ABNORMAL LOW (ref 101–111)
Chloride: 103 mmol/L (ref 101–111)
Creatinine, Ser: 0.9 mg/dL (ref 0.61–1.24)
GFR calc Af Amer: >60 mL/min (ref >60)
GFR calc non Af Amer: >60 mL/min (ref >60)
GFR calc non Af Amer: >60 mL/min (ref >60)
GLUCOSE: 368 mg/dL — AB (ref 65–99)
GLUCOSE: 112 mg/dL — AB (ref 65–99)
POTASSIUM: 3.6 mmol/L (ref 3.5–5.1)
Potassium: 4 mmol/L (ref 3.5–5.1)
Sodium: 135 mmol/L (ref 135–145)
Sodium: 136 mmol/L (ref 135–145)

## 2015-08-08 LAB — GLUCOSE, CAPILLARY
GLUCOSE-CAPILLARY: 148 mg/dL — AB (ref 65–99)
GLUCOSE-CAPILLARY: 122 mg/dL — AB (ref 65–99)
GLUCOSE-CAPILLARY: 113 mg/dL — AB (ref 65–99)
GLUCOSE-CAPILLARY: 166 mg/dL — AB (ref 65–99)
GLUCOSE-CAPILLARY: 186 mg/dL — AB (ref 65–99)
Glucose-Capillary: 221 mg/dL — ABNORMAL HIGH (ref 65–99)
Glucose-Capillary: 226 mg/dL — ABNORMAL HIGH (ref 65–99)
Glucose-Capillary: 129 mg/dL — ABNORMAL HIGH (ref 65–99)
Glucose-Capillary: 158 mg/dL — ABNORMAL HIGH (ref 65–99)
Glucose-Capillary: 268 mg/dL — ABNORMAL HIGH (ref 65–99)
Glucose-Capillary: 106 mg/dL — ABNORMAL HIGH (ref 65–99)
Glucose-Capillary: 160 mg/dL — ABNORMAL HIGH (ref 65–99)
Glucose-Capillary: 413 mg/dL — ABNORMAL HIGH (ref 65–99)
Glucose-Capillary: 304 mg/dL — ABNORMAL HIGH (ref 65–99)

## 2015-08-08 LAB — HEPATIC FUNCTION PANEL
ALT: 87 U/L — AB (ref 17–63)
AST: 83 U/L — AB (ref 15–41)
Albumin: 3.2 g/dL — ABNORMAL LOW (ref 3.5–5.0)
Alkaline Phosphatase: 164 U/L — ABNORMAL HIGH (ref 38–126)
BILIRUBIN DIRECT: 0.2 mg/dL (ref 0.1–0.5)
BILIRUBIN INDIRECT: 0.7 mg/dL (ref 0.3–0.9)
BILIRUBIN TOTAL: 0.9 mg/dL (ref 0.3–1.2)
Total Protein: 5.5 g/dL — ABNORMAL LOW (ref 6.5–8.1)

## 2015-08-08 LAB — RAPID URINE DRUG SCREEN, HOSP PERFORMED
AMPHETAMINES: NOT DETECTED
Barbiturates: NOT DETECTED
Benzodiazepines: NOT DETECTED
COCAINE: NOT DETECTED
OPIATES: NOT DETECTED
Tetrahydrocannabinol: NOT DETECTED

## 2015-08-08 LAB — MRSA PCR SCREENING: MRSA BY PCR: POSITIVE — AB

## 2015-08-08 MED ORDER — INSULIN ASPART 100 UNIT/ML ~~LOC~~ SOLN
0.0000 [IU] | Freq: Three times a day (TID) | SUBCUTANEOUS | Status: DC
  Administered 2015-08-08: 3 [IU] via SUBCUTANEOUS
  Administered 2015-08-09: 8 [IU] via SUBCUTANEOUS
  Administered 2015-08-09: 11 [IU] via SUBCUTANEOUS
  Administered 2015-08-10: 8 [IU] via SUBCUTANEOUS
  Administered 2015-08-10: 20 [IU] via SUBCUTANEOUS
  Administered 2015-08-10: 3 [IU] via SUBCUTANEOUS
  Administered 2015-08-11: 15 [IU] via SUBCUTANEOUS

## 2015-08-08 MED ORDER — INSULIN ASPART 100 UNIT/ML ~~LOC~~ SOLN
0.0000 [IU] | Freq: Every day | SUBCUTANEOUS | Status: DC
  Administered 2015-08-08: 4 [IU] via SUBCUTANEOUS
  Administered 2015-08-09: 5 [IU] via SUBCUTANEOUS
  Administered 2015-08-10: 4 [IU] via SUBCUTANEOUS

## 2015-08-08 MED ORDER — CHLORHEXIDINE GLUCONATE CLOTH 2 % EX PADS
6.0000 | MEDICATED_PAD | Freq: Every day | CUTANEOUS | Status: DC
  Administered 2015-08-09 – 2015-08-11 (×4): 6 via TOPICAL

## 2015-08-08 MED ORDER — MUPIROCIN 2 % EX OINT
1.0000 "application " | TOPICAL_OINTMENT | Freq: Two times a day (BID) | CUTANEOUS | Status: DC
  Administered 2015-08-08 – 2015-08-11 (×7): 1 via NASAL
  Filled 2015-08-08 (×4): qty 22

## 2015-08-08 MED ORDER — INSULIN GLARGINE 100 UNIT/ML ~~LOC~~ SOLN
28.0000 [IU] | Freq: Every day | SUBCUTANEOUS | Status: DC
  Administered 2015-08-08 – 2015-08-09 (×2): 28 [IU] via SUBCUTANEOUS
  Filled 2015-08-08 (×4): qty 0.28

## 2015-08-08 MED ORDER — INSULIN ASPART 100 UNIT/ML ~~LOC~~ SOLN
20.0000 [IU] | Freq: Once | SUBCUTANEOUS | Status: AC
  Administered 2015-08-08: 20 [IU] via SUBCUTANEOUS

## 2015-08-08 NOTE — Progress Notes (Signed)
Md aware of pt's labs.  No new orders at this time.  Will continue to monitor Karena AddisonCoro, Hoang Pettingill T

## 2015-08-08 NOTE — Progress Notes (Signed)
Md notified per pt request for something to drink.  Awaiting call back.  Will continue to monitor Karena AddisonCoro, Asami Lambright T

## 2015-08-08 NOTE — Progress Notes (Signed)
PROGRESS NOTE  Marco Beasley YQM:[id number] DOB: December 31, 1980 DOA: 08/07/2015 PCP: PROVIDER NOT IN SYSTEM  Brief history 34 year old male with a history of type 1 diabetes mellitus presenting with 3 days of nausea, vomiting, abdominal pain. The patient was found to have serum glucose of 915 with anion gap of 17 at the time of presentation. The patient was treated for DKA and started on intravenous insulin and intravenous fluids. His anion gap closed, and the patient was transitioned to subcutaneous insulin. The patient endorsed compliance with his insulin and claims that he feels he contracted a viral infection with URI symptoms that may have caused his DKA. Assessment/Plan: DKA in type 1 diabetes mellitus -Patient initially started on intravenous fluids and intravenous insulin -Transitioned to subcutaneous insulin -Lantus 28 units daily -NovoLog sliding scale -question pt compliance -HbA1C - 12 on 05/08/15  AKI -Secondary to volume depletion -Improved with IV fluids Transaminasemia -abd [place] -Hepatitis B surface antigen -Hepatitis C antibody -HIV antibody Hyponatremia -Secondary to hyperglycemia and volume depletion -Improved with fluid resuscitation    Family Communication:   Pt at beside Disposition Plan:   Home 08/09/15 if stable      Procedures/Studies: Dg Chest 2 View  08/07/2015  CLINICAL DATA:  Left-sided chest pain, shortness of breath for 1 week EXAM: CHEST  2 VIEW COMPARISON:  08/06/2015 FINDINGS: The heart size and mediastinal contours are within normal limits. Both lungs are clear. The visualized skeletal structures are unremarkable. IMPRESSION: No active cardiopulmonary disease. Electronically Signed   By: Elige Ko   On: 08/07/2015 18:42         Subjective: Patient denies fevers, chills, headache, chest pain, dyspnea, nausea, vomiting, diarrhea,  dysuria, hematuria; complains of some lower abdominal pain.  Denies any hematochezia,  melena   Objective: Filed Vitals:   08/08/15 0700 08/08/15 0755 08/08/15 0800 08/08/15 0900  BP: 118/56  124/73 129/56  Pulse: 76 78 79 80  Temp:  98.2 F (36.8 C)    TempSrc:  Oral    Resp: Height:      Weight:      SpO2: 98% 99% 96% 98%    Intake/Output Summary (Last 24 hours) at 08/08/15 1027 Last data filed at 08/08/15 1003  Gross per 24 hour  Intake 626.24 ml  Output   1300 ml  Net -673.76 ml   Weight change:  Exam:   General:  Pt is alert, follows commands appropriately, not in acute distress  HEENT: No icterus, No thrush, No neck mass, [place]/AT  Cardiovascular: RRR, S1/S2, no rubs, no gallops  Respiratory: CTA bilaterally, no wheezing, no crackles, no rhonchi  Abdomen: Soft/+BS, LLQ, RLQ tender without rebound, non distended, no guarding  Extremities: No edema, No lymphangitis, No petechiae, No rashes, no synovitis  Data Reviewed: Basic Metabolic Panel:  Recent Labs Lab 08/07/15 1839 08/07/15 2320 08/08/15 0528  NA 124* 135 136  K 5.2* 4.0 3.6  CL 90* 100* 103  CO2 17* 20* 24  GLUCOSE 915* 368* 112*  BUN CREATININE 1.32* 1.22 0.90  CALCIUM 8.6* 8.9 8.4*   Liver Function Tests:  Recent Labs Lab 08/07/15 1839 08/08/15 0528  AST 158* 83*  ALT 105* 87*  ALKPHOS 214* 164*  BILITOT 1.4* 0.9  PROT 6.5 5.5*  ALBUMIN 3.3* 3.2*   No results for input(s): LIPASE, AMYLASE in the last 168 hours. No results for input(s): AMMONIA in the last  168 hours. CBC:  Recent Labs Lab 08/07/15 1839  WBC 4.4  HGB 13.9  HCT 41.3  MCV 92.8  PLT 177   Cardiac Enzymes: No results for input(s): CKTOTAL, CKMB, CKMBINDEX, TROPONINI in the last 168 hours. BNP: Invalid input(s): POCBNP CBG:  Recent Labs Lab 08/08/15 0349 08/08/15 0450 08/08/15 0546 08/08/15 0641 08/08/15 0749  GLUCAP 148* 129* 122* 106* 113*    Recent Results (from the past 240 hour(s))  MRSA PCR Screening     Status: Abnormal   Collection Time: 08/08/15  1:07  AM  Result Value Ref Range Status   MRSA by PCR POSITIVE (A) NEGATIVE Final    Comment:        The GeneXpert MRSA Assay (FDA approved for NASAL specimens only), is one component of a comprehensive MRSA colonization surveillance program. It is not intended to diagnose MRSA infection nor to guide or monitor treatment for MRSA infections. RESULT CALLED TO, READ BACK BY AND VERIFIED WITH: JUDY CORIA @0335  08/08/15 MKELLY      Scheduled Meds: . Chlorhexidine Gluconate Cloth  6 each Topical Q0600  . enoxaparin (LOVENOX) injection  40 mg Subcutaneous Q24H  . insulin aspart  0-15 Units Subcutaneous TID WC  . insulin aspart  0-5 Units Subcutaneous QHS  . insulin glargine  28 Units Subcutaneous Daily  . mupirocin ointment  1 application Nasal BID   Continuous Infusions:    Vianca Bracher, DO  [facility] Pager [phone]  If 7PM-7AM, please contact night-coverage [email or web address] Password [email or web address] 08/08/2015, 10:27 AM   LOS: 1 day

## 2015-08-08 NOTE — Progress Notes (Signed)
Utilization review completed. Gervase Colberg, RN, BSN. 

## 2015-08-08 NOTE — Progress Notes (Addendum)
Pt transferred to 6N21 via wheelchair from 2H21.  Pt AAO X 4.  Pt on RA.  Pt has 20G to Rt and Lt AC SL.  Pt has belongings to bedside.  [place]  Pt has no complaints at the moment.  Will continue to monitor.

## 2015-08-08 NOTE — Progress Notes (Signed)
Md paged with lab results and cbg 122 and insulin 1.2.  New orders received. Will continue to monitor Marco AddisonCoro, [name]

## 2015-08-08 NOTE — Progress Notes (Signed)
Lab called of positive MRSA.  Pt placed on contact.  md made aware.  Will continue to monitor Marco Beasley, Marco Beasley

## 2015-08-09 ENCOUNTER — Inpatient Hospital Stay (HOSPITAL_COMMUNITY): Payer: Self-pay

## 2015-08-09 DIAGNOSIS — K838 Other specified diseases of biliary tract: Secondary | ICD-10-CM

## 2015-08-09 LAB — COMPREHENSIVE METABOLIC PANEL
ALBUMIN: 2.8 g/dL — AB (ref 3.5–5.0)
ALK PHOS: 151 U/L — AB (ref 38–126)
ALT: 87 U/L — ABNORMAL HIGH (ref 17–63)
AST: 91 U/L — AB (ref 15–41)
Anion gap: 11 (ref 5–15)
BILIRUBIN TOTAL: 0.5 mg/dL (ref 0.3–1.2)
BUN: 15 mg/dL (ref 6–20)
CO2: 23 mmol/L (ref 22–32)
CREATININE: 0.83 mg/dL (ref 0.61–1.24)
Calcium: 8.8 mg/dL — ABNORMAL LOW (ref 8.9–10.3)
Chloride: 101 mmol/L (ref 101–111)
GFR calc Af Amer: >60 mL/min (ref >60)
GFR calc non Af Amer: >60 mL/min (ref >60)
GLUCOSE: 345 mg/dL — AB (ref 65–99)
Potassium: 4.7 mmol/L (ref 3.5–5.1)
Sodium: 135 mmol/L (ref 135–145)
Total Protein: 5.5 g/dL — ABNORMAL LOW (ref 6.5–8.1)

## 2015-08-09 LAB — GLUCOSE, CAPILLARY
GLUCOSE-CAPILLARY: 105 mg/dL — AB (ref 65–99)
Glucose-Capillary: 306 mg/dL — ABNORMAL HIGH (ref 65–99)
Glucose-Capillary: 252 mg/dL — ABNORMAL HIGH (ref 65–99)
Glucose-Capillary: 363 mg/dL — ABNORMAL HIGH (ref 65–99)

## 2015-08-09 LAB — HIV ANTIBODY (ROUTINE TESTING W REFLEX): HIV Screen 4th Generation wRfx: NONREACTIVE

## 2015-08-09 LAB — HEPATITIS B SURFACE ANTIGEN: HEP B S AG: NEGATIVE

## 2015-08-09 LAB — CBC
HEMATOCRIT: 38.1 % — AB (ref 39.0–52.0)
HEMOGLOBIN: 13.2 g/dL (ref 13.0–17.0)
MCH: 31.1 pg (ref 26.0–34.0)
MCHC: 34.6 g/dL (ref 30.0–36.0)
MCV: 89.9 fL (ref 78.0–100.0)
Platelets: 168 10*3/uL (ref 150–400)
RBC: 4.24 MIL/uL (ref 4.22–5.81)
RDW: 12.6 % (ref 11.5–15.5)
WBC: 4.7 10*3/uL (ref 4.0–10.5)

## 2015-08-09 LAB — HEPATITIS C ANTIBODY: HCV Ab: <0.1 s/co ratio (ref 0.0–0.9)

## 2015-08-09 MED ORDER — INSULIN GLARGINE 100 UNIT/ML ~~LOC~~ SOLN
35.0000 [IU] | Freq: Every day | SUBCUTANEOUS | Status: DC
  Administered 2015-08-10 – 2015-08-11 (×2): 35 [IU] via SUBCUTANEOUS
  Filled 2015-08-09 (×2): qty 0.35

## 2015-08-09 MED ORDER — INSULIN ASPART 100 UNIT/ML ~~LOC~~ SOLN
5.0000 [IU] | Freq: Three times a day (TID) | SUBCUTANEOUS | Status: DC
  Administered 2015-08-09 – 2015-08-11 (×4): 5 [IU] via SUBCUTANEOUS

## 2015-08-09 MED ORDER — GADOBENATE DIMEGLUMINE 529 MG/ML IV SOLN
15.0000 mL | Freq: Once | INTRAVENOUS | Status: AC | PRN
  Administered 2015-08-09: 15 mL via INTRAVENOUS

## 2015-08-09 NOTE — Progress Notes (Addendum)
Inpatient Diabetes Program Recommendations  AACE/ADA: New Consensus Statement on Inpatient Glycemic Control (2015)  Target Ranges:  Prepandial:   less than 140 mg/dL      Peak postprandial:   less than 180 mg/dL (1-2 hours)      Critically ill patients:  140 - 180 mg/dL   Results for Marco Beasley, Marco Beasley (MRN [id number]) as of 08/09/2015 10:28  Ref. Range 08/08/2015 09:55 08/08/2015 11:29 08/08/2015 18:53 08/08/2015 22:08  Glucose-Capillary Latest Ref Range: 65-99 mg/dL 829166 (H) 562186 (H) 130413 (H) 304 (H)    Results for Marco Beasley, Marco Beasley (MRN [id number]) as of 08/09/2015 10:28  Ref. Range 08/09/2015 07:57  Glucose-Capillary Latest Ref Range: 65-99 mg/dL 295306 (H)    Admit with: DKA  History: DM Type 1  Home DM Meds: Lantus 35 units QHS       Novolog 5 units tid with meals       Novolog 1 unit for every 100 mg/dl > Target CBG 284100 mg/dl  Current Insulin Orders: Lantus 28 units daily      Novolog Moderate SSI (0-15 units) TID AC + HS   -Spoke with patient this AM about his home insulin regimen.  Patient stated he takes Lantus 35 units QHS at home.  Also takes Novolog 5 units tid with meals and Novolog 1 unit for every 100 mg/Beasley greater than target CBG of 100 mg/dl.  -[place].  Plans to follow up with ENDO after Beasley/c.       MD- Please consider the following in-hospital insulin adjustments:  1. Increase Lantus to 35 units daily (home dose is 35 units daily)  2. Start Novolog Meal Coverage- Novolog 5 units tid with meals  3. Decrease Novolog SSI to Sensitive scale (0-9 units) TID AC + HS    --Will follow patient during hospitalization--  Ambrose FinlandJeannine Johnston Lucciana Head RN, MSN, CDE Diabetes Coordinator Inpatient Glycemic Control Team Team Pager: [phone] (8a-5p)

## 2015-08-09 NOTE — Progress Notes (Signed)
PROGRESS NOTE  Marco Roicholas D Beasley WUJ:[id number] DOB: [birth year] DOA: 08/07/2015 PCP: PROVIDER NOT IN SYSTEM    Brief history 34 year old male with a history of type 1 diabetes mellitus presenting with 3 days of nausea, vomiting, abdominal pain. The patient was found to have serum glucose of 915 with anion gap of 17 at the time of presentation. The patient was treated for DKA and started on intravenous insulin and intravenous fluids. His anion gap closed, and the patient was transitioned to subcutaneous insulin. The patient endorsed compliance with his insulin and claims that he feels he contracted a viral infection with URI symptoms that may have caused his DKA. Assessment/Plan: DKA in type 1 diabetes mellitus -Patient initially started on intravenous fluids and intravenous insulin -Transitioned to subcutaneous insulin -Increase Lantus 35 units daily -add novolog 5 units with meals -NovoLog sliding scale -question pt compliance -HbA1C - 12 on 05/08/15 AKI -Secondary to volume depletion -Improved with IV fluids Transaminasemia -RUQ US--CBD dilatation 8.3 mm, borderline GB wall thickening -discussed with GI, Dr. Buccini--recommended MRCP-->if abnormal and LFTs don't improve--will consult -Hepatitis B surface antigen--negative -Hepatitis C antibody--negative -HIV antibody Hyponatremia -Secondary to hyperglycemia and volume depletion -Improved with fluid resuscitation    Family Communication: Pt at beside Disposition Plan: Home 08/10/15 if stable    Procedures/Studies: Dg Chest 2 View  08/07/2015  CLINICAL DATA:  Left-sided chest pain, shortness of breath for 1 week EXAM: CHEST  2 VIEW COMPARISON:  08/06/2015 FINDINGS: The heart size and mediastinal contours are within normal limits. Both lungs are clear. The visualized skeletal structures are unremarkable. IMPRESSION: No active cardiopulmonary disease. Electronically Signed   By: Elige KoHetal  Patel   On: 08/07/2015  18:42   [place] Abdomen Limited Ruq  08/08/2015  CLINICAL DATA:  Abnormal liver enzymes EXAM: US ABDOMEN LIMITED - RIGHT UPPER QUADRANT COMPARISON:  None. FINDINGS: Gallbladder: No shadowing gallstones are noted within gallbladder. A gallbladder wall polyp measures 3 mm. No sonographic Murphy's sign. Borderline thickening of gallbladder wall up to 3 mm. Common bile duct: Diameter: 8.3 mm in diameter mild prominent in size Liver: No focal lesion identified. Within normal limits in parenchymal echogenicity. IMPRESSION: No shadowing gallstones are noted within gallbladder. There is 3 mm gallbladder wall polyp. CBD dilatation up to 8.3 mm. Borderline thickening of gallbladder wall up to 3 mm. No sonographic Murphy's sign. Electronically Signed   By: Natasha MeadLiviu  Pop M.D.   On: 08/08/2015 11:29         Subjective: Patient states that he had 2 episodes of vomiting today. Denies any fevers, chills, chest pain, shortness breath, hematochezia, melena. No diarrhea. Denies any headache or visual disturbance.  Objective: Filed Vitals:   08/08/15 1704 08/08/15 2211 08/09/15 0552 08/09/15 1401  BP: 151/82 122/76 128/75 106/61  Pulse:  65 58 72  Temp: 98.4 F (36.9 C) 98.2 F (36.8 C) 98.3 F (36.8 C) 98.3 F (36.8 C)  TempSrc: Oral Oral  Oral  Resp:  18 17 18   Height:  5\' 8"  (1.727 m)    Weight:  72.712 kg (160 lb 4.8 oz)    SpO2: 97% 98% 100% 98%    Intake/Output Summary (Last 24 hours) at 08/09/15 1431 Last data filed at 08/09/15 0900  Gross per 24 hour  Intake    240 ml  Output    600 ml  Net   -360 ml   Weight change: -5.307 kg (-11 lb 11.2 oz) Exam:   General:  Pt is alert, follows commands appropriately, not in acute distress  HEENT: No icterus, No thrush, No neck mass, [place]/AT  Cardiovascular: RRR, S1/S2, no rubs, no gallops  Respiratory: CTA bilaterally, no wheezing, no crackles, no rhonchi  Abdomen: Soft/+BS, RLQ, LLQ pain without guarding, non distended, no guarding  Extremities:  No edema, No lymphangitis, No petechiae, No rashes, no synovitis  Data Reviewed: Basic Metabolic Panel:  Recent Labs Lab 08/07/15 1839 08/07/15 2320 08/08/15 0528 08/09/15 0443  NA 124* 135 136 135  K 5.2* 4.0 3.6 4.7  CL 90* 100* 103 101  CO2 17* 20* 24 23  GLUCOSE 915* 368* 112* 345*  BUN CREATININE 1.32* 1.22 0.90 0.83  CALCIUM 8.6* 8.9 8.4* 8.8*   Liver Function Tests:  Recent Labs Lab 08/07/15 1839 08/08/15 0528 08/09/15 0443  AST 158* 83* 91*  ALT 105* 87* 87*  ALKPHOS 214* 164* 151*  BILITOT 1.4* 0.9 0.5  PROT 6.5 5.5* 5.5*  ALBUMIN 3.3* 3.2* 2.8*   No results for input(s): LIPASE, AMYLASE in the last 168 hours. No results for input(s): AMMONIA in the last 168 hours. CBC:  Recent Labs Lab 08/07/15 1839 08/09/15 0443  WBC 4.4 4.7  HGB 13.9 13.2  HCT 41.3 38.1*  MCV 92.8 89.9  PLT 177 168   Cardiac Enzymes: No results for input(s): CKTOTAL, CKMB, CKMBINDEX, TROPONINI in the last 168 hours. BNP: Invalid input(s): POCBNP CBG:  Recent Labs Lab 08/08/15 1129 08/08/15 1853 08/08/15 2208 08/09/15 0757 08/09/15 1154  GLUCAP 186* 413* 304* 306* 252*    Recent Results (from the past 240 hour(s))  MRSA PCR Screening     Status: Abnormal   Collection Time: 08/08/15  1:07 AM  Result Value Ref Range Status   MRSA by PCR POSITIVE (A) NEGATIVE Final    Comment:        The GeneXpert MRSA Assay (FDA approved for NASAL specimens only), is one component of a comprehensive MRSA colonization surveillance program. It is not intended to diagnose MRSA infection nor to guide or monitor treatment for MRSA infections. RESULT CALLED TO, READ BACK BY AND VERIFIED WITH: JUDY CORIA  08/08/15 MKELLY      Scheduled Meds: . Chlorhexidine Gluconate Cloth  6 each Topical Q0600  . enoxaparin (LOVENOX) injection  40 mg Subcutaneous Q24H  . insulin aspart  0-15 Units Subcutaneous TID WC  . insulin aspart  0-5 Units Subcutaneous QHS  . insulin  glargine  28 Units Subcutaneous Daily  . mupirocin ointment  1 application Nasal BID   Continuous Infusions:    Dajsha Massaro, DO  [facility] Pager [phone]  If 7PM-7AM, please contact night-coverage [email or web address] Password [email or web address] 08/09/2015, 2:31 PM   LOS: 2 days

## 2015-08-10 ENCOUNTER — Encounter (HOSPITAL_COMMUNITY): Payer: Self-pay | Admitting: Gastroenterology

## 2015-08-10 LAB — COMPREHENSIVE METABOLIC PANEL
ALBUMIN: 3.1 g/dL — AB (ref 3.5–5.0)
ALK PHOS: 148 U/L — AB (ref 38–126)
ALT: 101 U/L — ABNORMAL HIGH (ref 17–63)
ANION GAP: 10 (ref 5–15)
AST: 108 U/L — ABNORMAL HIGH (ref 15–41)
BILIRUBIN TOTAL: 0.6 mg/dL (ref 0.3–1.2)
BUN: 16 mg/dL (ref 6–20)
CALCIUM: 8.6 mg/dL — AB (ref 8.9–10.3)
CO2: 25 mmol/L (ref 22–32)
Chloride: 101 mmol/L (ref 101–111)
Creatinine, Ser: 0.84 mg/dL (ref 0.61–1.24)
GFR calc Af Amer: >60 mL/min (ref >60)
GLUCOSE: 303 mg/dL — AB (ref 65–99)
Potassium: 4.2 mmol/L (ref 3.5–5.1)
Sodium: 136 mmol/L (ref 135–145)
TOTAL PROTEIN: 5.8 g/dL — AB (ref 6.5–8.1)

## 2015-08-10 LAB — GLUCOSE, CAPILLARY
GLUCOSE-CAPILLARY: 299 mg/dL — AB (ref 65–99)
GLUCOSE-CAPILLARY: 469 mg/dL — AB (ref 65–99)
Glucose-Capillary: 159 mg/dL — ABNORMAL HIGH (ref 65–99)
Glucose-Capillary: 389 mg/dL — ABNORMAL HIGH (ref 65–99)

## 2015-08-10 NOTE — Progress Notes (Signed)
PROGRESS NOTE  Marco Beasley VWU:[id number] DOB: October 10, 1981 DOA: 08/07/2015 PCP: PROVIDER NOT IN SYSTEM Brief history 34 year old male with a history of type 1 diabetes mellitus presenting with 3 days of nausea, vomiting, abdominal pain. The patient was found to have serum glucose of 915 with anion gap of 17 at the time of presentation. The patient was treated for DKA and started on intravenous insulin and intravenous fluids. His anion gap closed, and the patient was transitioned to subcutaneous insulin. The patient endorsed compliance with his insulin and claims that he feels he contracted a viral infection with URI symptoms that may have caused his DKA. The patient was noted to have elevated transaminases. In conjunction with this abdominal pain, right upper quadrant ultrasound was obtained. It revealed dilated biliary ducts. MRCP was obtained. There was no choledocholithiasis. Ultimately, HIDA scan was ordered Assessment/Plan: DKA in type 1 diabetes mellitus -Patient initially started on intravenous fluids and intravenous insulin -Transitioned to subcutaneous insulin -Increased Lantus 35 units daily -added novolog 5 units with meals -NovoLog sliding scale -question pt compliance -HbA1C - 12 on 05/08/15 AKI -Secondary to volume depletion -Improved with IV fluids Transaminasemia -RUQ US--CBD dilatation 8.3 mm, borderline GB wall thickening -MRCP-- ULN CBD, no choledocholithiasis -order HIDA scan -appreciate GI consult--order ceruoplasmin, alpha-1-antitrypsin, ANA, ASMA, ferritin -Hepatitis B surface antigen--negative -Hepatitis C antibody--negative -HIV antibody--neg Hyponatremia -Secondary to hyperglycemia and volume depletion -Improved with fluid resuscitation   Family Communication:   Pt at beside Disposition Plan:   Home 1-2 days     Procedures/Studies: Dg Chest 2 View  08/07/2015  CLINICAL DATA:  Left-sided chest pain, shortness of breath for 1 week EXAM:  CHEST  2 VIEW COMPARISON:  08/06/2015 FINDINGS: The heart size and mediastinal contours are within normal limits. Both lungs are clear. The visualized skeletal structures are unremarkable. IMPRESSION: No active cardiopulmonary disease. Electronically Signed   By: Elige Ko   On: 08/07/2015 18:42   Mr 3d Recon At Scanner  08/09/2015  CLINICAL DATA:  Elevated LFTs, mild dilatation of the common bile duct EXAM: MRI ABDOMEN WITHOUT AND WITH CONTRAST (INCLUDING MRCP) TECHNIQUE: Multiplanar multisequence MR imaging of the abdomen was performed both before and after the administration of intravenous contrast. Heavily T2-weighted images of the biliary and pancreatic ducts were obtained, and three-dimensional MRCP images were rendered by post processing. CONTRAST:  15mL MULTIHANCE GADOBENATE DIMEGLUMINE 529 MG/ML IV SOLN COMPARISON:  Right upper quadrant ultrasound dated 08/08/2015 FINDINGS: Lower chest:  Lung bases are clear. Hepatobiliary: Liver is within normal limits. No suspicious/enhancing hepatic lesions. No hepatic steatosis. Gallbladder is unremarkable. No intrahepatic or extrahepatic ductal dilatation. Common duct measures 6.5 mm, within the upper limits of normal. No choledocholithiasis is seen. Pancreas: Within normal limits. Spleen: Within normal limits. Adrenals/Urinary Tract: Adrenal glands are within normal limits. Kidneys are within normal limits.  No hydronephrosis. Stomach/Bowel: Stomach is within normal limits. Visualized bowel is unremarkable. Vascular/Lymphatic: No evidence of abdominal aortic aneurysm. No suspicious abdominal lymphadenopathy. Other: No abdominal ascites. Musculoskeletal: No focal osseous lesions. IMPRESSION: No intrahepatic or extrahepatic ductal dilatation. Common duct is at the upper limits of normal. No choledocholithiasis is seen. Otherwise negative MRI abdomen. Electronically Signed   By: Charline Bills M.D.   On: 08/09/2015 17:17   Mr Abd W/wo Cm/mrcp  08/09/2015   CLINICAL DATA:  Elevated LFTs, mild dilatation of the common bile duct EXAM: MRI ABDOMEN WITHOUT AND WITH CONTRAST (INCLUDING MRCP) TECHNIQUE: Multiplanar multisequence MR  imaging of the abdomen was performed both before and after the administration of intravenous contrast. Heavily T2-weighted images of the biliary and pancreatic ducts were obtained, and three-dimensional MRCP images were rendered by post processing. CONTRAST:  15mL MULTIHANCE GADOBENATE DIMEGLUMINE 529 MG/ML IV SOLN COMPARISON:  Right upper quadrant ultrasound dated 08/08/2015 FINDINGS: Lower chest:  Lung bases are clear. Hepatobiliary: Liver is within normal limits. No suspicious/enhancing hepatic lesions. No hepatic steatosis. Gallbladder is unremarkable. No intrahepatic or extrahepatic ductal dilatation. Common duct measures 6.5 mm, within the upper limits of normal. No choledocholithiasis is seen. Pancreas: Within normal limits. Spleen: Within normal limits. Adrenals/Urinary Tract: Adrenal glands are within normal limits. Kidneys are within normal limits.  No hydronephrosis. Stomach/Bowel: Stomach is within normal limits. Visualized bowel is unremarkable. Vascular/Lymphatic: No evidence of abdominal aortic aneurysm. No suspicious abdominal lymphadenopathy. Other: No abdominal ascites. Musculoskeletal: No focal osseous lesions. IMPRESSION: No intrahepatic or extrahepatic ductal dilatation. Common duct is at the upper limits of normal. No choledocholithiasis is seen. Otherwise negative MRI abdomen. Electronically Signed   By: Charline BillsSriyesh  Krishnan M.D.   On: 08/09/2015 17:17   [place] Abdomen Limited Ruq  08/08/2015  CLINICAL DATA:  Abnormal liver enzymes EXAM: US ABDOMEN LIMITED - RIGHT UPPER QUADRANT COMPARISON:  None. FINDINGS: Gallbladder: No shadowing gallstones are noted within gallbladder. A gallbladder wall polyp measures 3 mm. No sonographic Murphy's sign. Borderline thickening of gallbladder wall up to 3 mm. Common bile duct: Diameter: 8.3  mm in diameter mild prominent in size Liver: No focal lesion identified. Within normal limits in parenchymal echogenicity. IMPRESSION: No shadowing gallstones are noted within gallbladder. There is 3 mm gallbladder wall polyp. CBD dilatation up to 8.3 mm. Borderline thickening of gallbladder wall up to 3 mm. No sonographic Murphy's sign. Electronically Signed   By: Natasha MeadLiviu  Pop M.D.   On: 08/08/2015 11:29         Subjective: Patient states that abdominal pain is little better, but still bothering him. He states that his vomiting has improved. He is able tolerate a card modified diet. Denies diffuse, chills, chest pain, shortness breath, diarrhea, hematochezia, melena. No rashes or synovitis.  Objective: Filed Vitals:   08/09/15 0552 08/09/15 1401 08/09/15 2201 08/10/15 0457  BP: 128/75 106/61 129/83 130/79  Pulse: 58 72 61 57  Temp: 98.3 F (36.8 C) 98.3 F (36.8 C) 98 F (36.7 C) 97.8 F (36.6 C)  TempSrc:  Oral Oral Oral  Resp: 17 18 18 18   Height:      Weight:      SpO2: 100% 98% 99% 98%   No intake or output data in the 24 hours ending 08/10/15 1511 Weight change:  Exam:   General:  Pt is alert, follows commands appropriately, not in acute distress  HEENT: No icterus, No thrush, No neck mass, [place]/AT  Cardiovascular: RRR, S1/S2, no rubs, no gallops  Respiratory: CTA bilaterally, no wheezing, no crackles, no rhonchi  Abdomen: Soft/+BS, LLQ, RLQ tender without guarding or peritoneal signs, non distended, no guarding; no hepatosplenomegaly  Extremities: No edema, No lymphangitis, No petechiae, No rashes, no synovitis; no cyanosis or clubbing  Data Reviewed: Basic Metabolic Panel:  Recent Labs Lab 08/07/15 1839 08/07/15 2320 08/08/15 0528 08/09/15 0443 08/10/15 0328  NA 124* 135 136 135 136  K 5.2* 4.0 3.6 4.7 4.2  CL 90* 100* 103 101 101  CO2 17* 20* 24 23 25   GLUCOSE 915* 368* 112* 345* 303*  BUN 17 14 12 15 16   CREATININE 1.32* 1.22  0.90 0.83 0.84  CALCIUM  8.6* 8.9 8.4* 8.8* 8.6*   Liver Function Tests:  Recent Labs Lab 08/07/15 1839 08/08/15 0528 08/09/15 0443 08/10/15 0328  AST 158* 83* 91* 108*  ALT 105* 87* 87* 101*  ALKPHOS 214* 164* 151* 148*  BILITOT 1.4* 0.9 0.5 0.6  PROT 6.5 5.5* 5.5* 5.8*  ALBUMIN 3.3* 3.2* 2.8* 3.1*   No results for input(s): LIPASE, AMYLASE in the last 168 hours. No results for input(s): AMMONIA in the last 168 hours. CBC:  Recent Labs Lab 08/07/15 1839 08/09/15 0443  WBC 4.4 4.7  HGB 13.9 13.2  HCT 41.3 38.1*  MCV 92.8 89.9  PLT 177 168   Cardiac Enzymes: No results for input(s): CKTOTAL, CKMB, CKMBINDEX, TROPONINI in the last 168 hours. BNP: Invalid input(s): POCBNP CBG:  Recent Labs Lab 08/09/15 1154 08/09/15 1705 08/09/15 2128 08/10/15 0821 08/10/15 1206  GLUCAP 252* 105* 363* 299* 159*    Recent Results (from the past 240 hour(s))  MRSA PCR Screening     Status: Abnormal   Collection Time: 08/08/15  1:07 AM  Result Value Ref Range Status   MRSA by PCR POSITIVE (A) NEGATIVE Final    Comment:        The GeneXpert MRSA Assay (FDA approved for NASAL specimens only), is one component of a comprehensive MRSA colonization surveillance program. It is not intended to diagnose MRSA infection nor to guide or monitor treatment for MRSA infections. RESULT CALLED TO, READ BACK BY AND VERIFIED WITH: JUDY CORIA  08/08/15 MKELLY      Scheduled Meds: . Chlorhexidine Gluconate Cloth  6 each Topical Q0600  . enoxaparin (LOVENOX) injection  40 mg Subcutaneous Q24H  . insulin aspart  0-15 Units Subcutaneous TID WC  . insulin aspart  0-5 Units Subcutaneous QHS  . insulin aspart  5 Units Subcutaneous TID WC  . insulin glargine  35 Units Subcutaneous Daily  . mupirocin ointment  1 application Nasal BID   Continuous Infusions:    Kristel Durkee, DO  [facility] Pager [phone]  If 7PM-7AM, please contact night-coverage [email or web address] Password [email or web address] 08/10/2015, 3:11  PM   LOS: 3 days

## 2015-08-10 NOTE — Progress Notes (Signed)
Dinnertime CBG: 469. Patient asymptomatic. MD notified and ordered for 20 units of novalog to be given. See MAR for administration.

## 2015-08-10 NOTE — Consult Note (Signed)
Reason for Consult: Abdominal pain mild abnormal liver tests Referring Physician: Hospital team  Marco Beasley is an 34 y.o. male.  HPI: Patient seen and examined and hospital computer chart reviewed and case discussed with my partner Dr. B and patient has a history of gastroparesis followed by GI at [facility] but has never had a liver problem and has had endoscopies before but no nuclear medicine gastric emptying studies or other abdominal tests and he's never been told his liver tests were abnormal and he does not donate blood and his family history is negative for any liver disease or gallbladder problems and there is no obvious iron or copper overload states in the family and he does not drink and minimizes over-the-counter medicine use and is actually feeling better and wants to eat and has no new complaints and no lower bowel complaints and he takes Reglan at home for his gastroparesis and he believes of virus that when around his family set off his DKA and he says he usually can work if his sugars are okay  Past Medical History  Diagnosis Date  . Diabetes mellitus without complication ([place])   . Hypertension   . COPD (chronic obstructive pulmonary disease) ([place])   . Gastroparesis     History reviewed. No pertinent past surgical history.  History reviewed. No pertinent family history.  Social History:  reports that he has been smoking.  He does not have any smokeless tobacco history on file. He reports that he does not drink alcohol or use illicit drugs.  Allergies: Penicillin   Medications: I have reviewed the patient's current medications.  Results for orders placed or performed during the hospital encounter of 08/07/15 (from the past 48 hour(s))  Glucose, capillary     Status: Abnormal   Collection Time: 08/08/15 11:29 AM  Result Value Ref Range   Glucose-Capillary 186 (H) 65 - 99 mg/dL   Comment 1 Capillary Specimen   Hepatitis B surface antigen     Status:  None   Collection Time: 08/08/15 11:40 AM  Result Value Ref Range   Hepatitis B Surface Ag Negative Negative    Comment: (NOTE) Performed At: [facility] [place], [place] [id number] Lindon Romp MD [id number]   Hepatitis C antibody     Status: None   Collection Time: 08/08/15 11:40 AM  Result Value Ref Range   HCV Ab <0.1 0.0 - 0.9 s/co ratio    Comment: (NOTE)                                  Negative:     < 0.8                             Indeterminate: 0.8 - 0.9                                  Positive:     > 0.9 The CDC recommends that a positive HCV antibody result be followed up with a HCV Nucleic Acid Amplification test (361443). Performed At: [facility] [place], [place] [id number] Lindon Romp MD [id number]   HIV antibody     Status: None   Collection Time: 08/08/15 11:40 AM  Result Value Ref Range   HIV Screen 4th Generation  wRfx Non Reactive Non Reactive    Comment: (NOTE) Performed At: [facility] [place], [place] [id number] Lindon Romp MD [id number]   Glucose, capillary     Status: Abnormal   Collection Time: 08/08/15  6:53 PM  Result Value Ref Range   Glucose-Capillary 413 (H) 65 - 99 mg/dL  Glucose, capillary     Status: Abnormal   Collection Time: 08/08/15 10:08 PM  Result Value Ref Range   Glucose-Capillary 304 (H) 65 - 99 mg/dL   Comment 1 Notify RN    Comment 2 Document in Chart   Comprehensive metabolic panel     Status: Abnormal   Collection Time: 08/09/15  4:43 AM  Result Value Ref Range   Sodium 135 135 - 145 mmol/L   Potassium 4.7 3.5 - 5.1 mmol/L    Comment: DELTA CHECK NOTED   Chloride 101 101 - 111 mmol/L   CO2 23 22 - 32 mmol/L   Glucose, Bld 345 (H) 65 - 99 mg/dL   BUN 15 6 - 20 mg/dL   Creatinine, Ser 0.83 0.61 - 1.24 mg/dL   Calcium 8.8 (L) 8.9 - 10.3 mg/dL   Total Protein 5.5 (L) 6.5 - 8.1 g/dL   Albumin 2.8 (L) 3.5 - 5.0 g/dL   AST 91  (H) 15 - 41 U/L   ALT 87 (H) 17 - 63 U/L   Alkaline Phosphatase 151 (H) 38 - 126 U/L   Total Bilirubin 0.5 0.3 - 1.2 mg/dL   GFR calc non Af Amer >60 >60 mL/min   GFR calc Af Amer >60 >60 mL/min    Comment: (NOTE) The eGFR has been calculated using the CKD EPI equation. This calculation has not been validated in all clinical situations. eGFR's persistently <60 mL/min signify possible Chronic Kidney Disease.    Anion gap 11 5 - 15  CBC     Status: Abnormal   Collection Time: 08/09/15  4:43 AM  Result Value Ref Range   WBC 4.7 4.0 - 10.5 K/uL   RBC 4.24 4.22 - 5.81 MIL/uL   Hemoglobin 13.2 13.0 - 17.0 g/dL   HCT 38.1 (L) 39.0 - 52.0 %   MCV 89.9 78.0 - 100.0 fL   MCH 31.1 26.0 - 34.0 pg   MCHC 34.6 30.0 - 36.0 g/dL   RDW 12.6 11.5 - 15.5 %   Platelets 168 150 - 400 K/uL  Glucose, capillary     Status: Abnormal   Collection Time: 08/09/15  7:57 AM  Result Value Ref Range   Glucose-Capillary 306 (H) 65 - 99 mg/dL   Comment 1 Notify RN   Glucose, capillary     Status: Abnormal   Collection Time: 08/09/15 11:54 AM  Result Value Ref Range   Glucose-Capillary 252 (H) 65 - 99 mg/dL   Comment 1 Notify RN   Glucose, capillary     Status: Abnormal   Collection Time: 08/09/15  5:05 PM  Result Value Ref Range   Glucose-Capillary 105 (H) 65 - 99 mg/dL   Comment 1 Notify RN   Glucose, capillary     Status: Abnormal   Collection Time: 08/09/15  9:28 PM  Result Value Ref Range   Glucose-Capillary 363 (H) 65 - 99 mg/dL  Comprehensive metabolic panel     Status: Abnormal   Collection Time: 08/10/15  3:28 AM  Result Value Ref Range   Sodium 136 135 - 145 mmol/L   Potassium 4.2 3.5 - 5.1 mmol/L   Chloride  101 101 - 111 mmol/L   CO2 25 22 - 32 mmol/L   Glucose, Bld 303 (H) 65 - 99 mg/dL   BUN 16 6 - 20 mg/dL   Creatinine, Ser 0.84 0.61 - 1.24 mg/dL   Calcium 8.6 (L) 8.9 - 10.3 mg/dL   Total Protein 5.8 (L) 6.5 - 8.1 g/dL   Albumin 3.1 (L) 3.5 - 5.0 g/dL   AST 108 (H) 15 - 41 U/L    ALT 101 (H) 17 - 63 U/L   Alkaline Phosphatase 148 (H) 38 - 126 U/L   Total Bilirubin 0.6 0.3 - 1.2 mg/dL   GFR calc non Af Amer >60 >60 mL/min   GFR calc Af Amer >60 >60 mL/min    Comment: (NOTE) The eGFR has been calculated using the CKD EPI equation. This calculation has not been validated in all clinical situations. eGFR's persistently <60 mL/min signify possible Chronic Kidney Disease.    Anion gap 10 5 - 15  Glucose, capillary     Status: Abnormal   Collection Time: 08/10/15  8:21 AM  Result Value Ref Range   Glucose-Capillary 299 (H) 65 - 99 mg/dL   Comment 1 Notify RN     Mr 3d Recon At Scanner  08/09/2015  CLINICAL DATA:  Elevated LFTs, mild dilatation of the common bile duct EXAM: MRI ABDOMEN WITHOUT AND WITH CONTRAST (INCLUDING MRCP) TECHNIQUE: Multiplanar multisequence MR imaging of the abdomen was performed both before and after the administration of intravenous contrast. Heavily T2-weighted images of the biliary and pancreatic ducts were obtained, and three-dimensional MRCP images were rendered by post processing. CONTRAST:  58m MULTIHANCE GADOBENATE DIMEGLUMINE 529 MG/ML IV SOLN COMPARISON:  Right upper quadrant ultrasound dated 08/08/2015 FINDINGS: Lower chest:  Lung bases are clear. Hepatobiliary: Liver is within normal limits. No suspicious/enhancing hepatic lesions. No hepatic steatosis. Gallbladder is unremarkable. No intrahepatic or extrahepatic ductal dilatation. Common duct measures 6.5 mm, within the upper limits of normal. No choledocholithiasis is seen. Pancreas: Within normal limits. Spleen: Within normal limits. Adrenals/Urinary Tract: Adrenal glands are within normal limits. Kidneys are within normal limits.  No hydronephrosis. Stomach/Bowel: Stomach is within normal limits. Visualized bowel is unremarkable. Vascular/Lymphatic: No evidence of abdominal aortic aneurysm. No suspicious abdominal lymphadenopathy. Other: No abdominal ascites. Musculoskeletal: No focal  osseous lesions. IMPRESSION: No intrahepatic or extrahepatic ductal dilatation. Common duct is at the upper limits of normal. No choledocholithiasis is seen. Otherwise negative MRI abdomen. Electronically Signed   By: SJulian HyM.D.   On: 08/09/2015 17:17   Mr Abd W/wo Cm/mrcp  08/09/2015  CLINICAL DATA:  Elevated LFTs, mild dilatation of the common bile duct EXAM: MRI ABDOMEN WITHOUT AND WITH CONTRAST (INCLUDING MRCP) TECHNIQUE: Multiplanar multisequence MR imaging of the abdomen was performed both before and after the administration of intravenous contrast. Heavily T2-weighted images of the biliary and pancreatic ducts were obtained, and three-dimensional MRCP images were rendered by post processing. CONTRAST:  139mMULTIHANCE GADOBENATE DIMEGLUMINE 529 MG/ML IV SOLN COMPARISON:  Right upper quadrant ultrasound dated 08/08/2015 FINDINGS: Lower chest:  Lung bases are clear. Hepatobiliary: Liver is within normal limits. No suspicious/enhancing hepatic lesions. No hepatic steatosis. Gallbladder is unremarkable. No intrahepatic or extrahepatic ductal dilatation. Common duct measures 6.5 mm, within the upper limits of normal. No choledocholithiasis is seen. Pancreas: Within normal limits. Spleen: Within normal limits. Adrenals/Urinary Tract: Adrenal glands are within normal limits. Kidneys are within normal limits.  No hydronephrosis. Stomach/Bowel: Stomach is within normal limits. Visualized bowel is unremarkable. Vascular/Lymphatic:  No evidence of abdominal aortic aneurysm. No suspicious abdominal lymphadenopathy. Other: No abdominal ascites. Musculoskeletal: No focal osseous lesions. IMPRESSION: No intrahepatic or extrahepatic ductal dilatation. Common duct is at the upper limits of normal. No choledocholithiasis is seen. Otherwise negative MRI abdomen. Electronically Signed   By: Julian Hy M.D.   On: 08/09/2015 17:17    ROS negative except above Blood pressure 130/79, pulse 57, temperature  97.8 F (36.6 C), temperature source Oral, resp. rate 18, height '5\' 8"'  (1.727 m), weight 72.712 kg (160 lb 4.8 oz), SpO2 98 %. Physical Exam vital signs stable afebrile no acute distress lungs are clear heart regular rate and rhythm abdomen is soft nontender no obvious succussion splash labs MRCP and ultrasound reviewed hepatitis B and C negative his liver tests are only minimally elevated and probably just related to his DKA  Assessment/Plan: Improved abdominal pain with minimal elevated LFTs Plan: Would recommend checking just to be complete ferritin ceruloplasmin and alpha-1 antitrypsin and autoimmune markers to include ANA and anti-smooth muscle antibody and agree with HIDA scan which is pending today and if abnormal consult surgery but if okay can slowly advance diet and have primary care physician follow-up lab work and we would be happy to see him back if abnormal liver tests continue or if worsening GI symptoms and please call us if we could be of any further assistance with this hospital stay  [facility] E 08/10/2015, 11:20 AM

## 2015-08-11 ENCOUNTER — Inpatient Hospital Stay (HOSPITAL_COMMUNITY): Payer: Self-pay

## 2015-08-11 LAB — COMPREHENSIVE METABOLIC PANEL
ALK PHOS: 162 U/L — AB (ref 38–126)
ALT: 220 U/L — ABNORMAL HIGH (ref 17–63)
AST: 302 U/L — ABNORMAL HIGH (ref 15–41)
Albumin: 3.3 g/dL — ABNORMAL LOW (ref 3.5–5.0)
Anion gap: 9 (ref 5–15)
BUN: 15 mg/dL (ref 6–20)
CALCIUM: 8.7 mg/dL — AB (ref 8.9–10.3)
CHLORIDE: 97 mmol/L — AB (ref 101–111)
CO2: 25 mmol/L (ref 22–32)
CREATININE: 0.81 mg/dL (ref 0.61–1.24)
GFR calc Af Amer: >60 mL/min (ref >60)
GFR calc non Af Amer: >60 mL/min (ref >60)
GLUCOSE: 370 mg/dL — AB (ref 65–99)
Potassium: 4.5 mmol/L (ref 3.5–5.1)
SODIUM: 131 mmol/L — AB (ref 135–145)
Total Bilirubin: 0.7 mg/dL (ref 0.3–1.2)
Total Protein: 6.1 g/dL — ABNORMAL LOW (ref 6.5–8.1)

## 2015-08-11 LAB — HEMOGLOBIN A1C
HEMOGLOBIN A1C: 11.5 % — AB (ref 4.8–5.6)
MEAN PLASMA GLUCOSE: 283 mg/dL

## 2015-08-11 LAB — FERRITIN: Ferritin: 238 ng/mL (ref 24–336)

## 2015-08-11 LAB — GLUCOSE, CAPILLARY: Glucose-Capillary: 398 mg/dL — ABNORMAL HIGH (ref 65–99)

## 2015-08-11 MED ORDER — SINCALIDE 5 MCG IJ SOLR
0.0200 ug/kg | Freq: Once | INTRAMUSCULAR | Status: AC
  Administered 2015-08-11: 1.45 ug via INTRAVENOUS
  Filled 2015-08-11: qty 5

## 2015-08-11 MED ORDER — INSULIN GLARGINE 100 UNIT/ML ~~LOC~~ SOLN: 45.0000 [IU] | Freq: Every day | SUBCUTANEOUS | Status: AC

## 2015-08-11 MED ORDER — INSULIN GLARGINE 100 UNIT/ML ~~LOC~~ SOLN: 45.0000 [IU] | Freq: Every day | SUBCUTANEOUS | Status: DC

## 2015-08-11 MED ORDER — TECHNETIUM TC 99M SESTAMIBI - CARDIOLITE: 30.0000 | Freq: Once | INTRAVENOUS | Status: DC | PRN

## 2015-08-11 MED ORDER — TECHNETIUM TC 99M MEBROFENIN IV KIT
5.0000 | PACK | Freq: Once | INTRAVENOUS | Status: DC | PRN
  Administered 2015-08-11: 5 via INTRAVENOUS
  Filled 2015-08-11: qty 6

## 2015-08-11 MED ORDER — SINCALIDE 5 MCG IJ SOLR
INTRAMUSCULAR | Status: AC
  Administered 2015-08-11: 1.45 ug via INTRAVENOUS
  Filled 2015-08-11: qty 5

## 2015-08-11 MED ORDER — STERILE WATER FOR INJECTION IJ SOLN
INTRAMUSCULAR | Status: AC
  Administered 2015-08-11: 5 mL
  Filled 2015-08-11: qty 10

## 2015-08-11 MED ORDER — TECHNETIUM TC 99M SESTAMIBI GENERIC - CARDIOLITE
10.0000 | Freq: Once | INTRAVENOUS | Status: AC | PRN
  Administered 2015-08-11: 10 via INTRAVENOUS

## 2015-08-11 NOTE — Discharge Summary (Addendum)
Physician Discharge Summary  Marco Beasley:[id number] DOB: 1981-08-10 DOA: 08/07/2015  PCP: PROVIDER NOT IN SYSTEM  Admit date: 08/07/2015 Discharge date: 08/11/2015  Recommendations for Outpatient Follow-up:  1. Pt will need to follow up with PCP in 2 weeks post discharge 2. Please obtain hepatic enzymes in one week 3. Please follow up ceruoplasmin, alpha-1-antitrypsin, ANA, ASMA, ferritin Discharge Diagnoses:   DKA in type 1 diabetes mellitus -Patient initially started on intravenous fluids and intravenous insulin -Transitioned to subcutaneous insulin -Increased Lantus 45 units daily -added novolog 5 units with meals -NovoLog sliding scale -question pt compliance -HbA1C - 12 on 05/08/15 -08/10/15 HBA1c--11.5  -pt "grazes" all day resulting in his elevated CBGs during the hospitalization AKI -Secondary to volume depletion -Improved with IV fluids Transaminasemia -RUQ US--CBD dilatation 8.3 mm, borderline GB wall thickening -MRCP-- ULN CBD, no choledocholithiasis -order HIDA scan--neg for cholecystitis, EF 99% -appreciate GI consult--ordered ceruoplasmin, alpha-1-antitrypsin, ANA, ASMA, ferritin -ferritin 238 -may ultimately need liver biopsy if labs unrevealing and LFTs remain elevated -Hepatitis B surface antigen--negative -Hepatitis C antibody--negative -HIV antibody--neg -UDS neg -diet advanced and pt tolerated well without any further n/v -elevations during the hospitalization may also be due to increased use of morphine IV Hyponatremia -Secondary to hyperglycemia and volume depletion -Improved with fluid resuscitation  Discharge Condition: stable  Disposition: home  Diet:carb modified Wt Readings from Last 3 Encounters:  08/08/15 72.712 kg (160 lb 4.8 oz)    History of present illness:   34 year old male with a history of type 1 diabetes mellitus presenting with 3 days of nausea, vomiting, abdominal pain. The patient was found to have serum  glucose of 915 with anion gap of 17 at the time of presentation. The patient was treated for DKA and started on intravenous insulin and intravenous fluids. His anion gap closed, and the patient was transitioned to subcutaneous insulin. The patient endorsed compliance with his insulin and claims that he feels he contracted a viral infection with URI symptoms that may have caused his DKA. The patient was noted to have elevated transaminases. In conjunction with this abdominal pain, right upper quadrant ultrasound was obtained. It revealed dilated biliary ducts. MRCP was obtained. There was no choledocholithiasis. Ultimately, HIDA scan was ordered which was negative.  His diet was advanced which he tolerated. Consultants: [facility]  Discharge Exam: Filed Vitals:   08/11/15 1355  BP: 111/62  Pulse: 92  Temp: 98.1 F (36.7 C)  Resp: 18   Filed Vitals:   08/10/15 1430 08/10/15 2140 08/11/15 0514 08/11/15 1355  BP: 116/59 131/86 129/85 111/62  Pulse: 68 64 66 92  Temp: 98.4 F (36.9 C) 98.4 F (36.9 C) 98.4 F (36.9 C) 98.1 F (36.7 C)  TempSrc: Oral Oral Axillary Oral  Resp: Height:      Weight:      SpO2: 99% 98% 98% 99%   General: A&O x 3, NAD, pleasant, cooperative Cardiovascular: RRR, no rub, no gallop, no S3 Respiratory: CTAB, no wheeze, no rhonchi Abdomen:soft, LLQ,RLQ tender without guarding or peritoneal signs, nondistended, positive bowel sounds Extremities: No edema, No lymphangitis, no petechiae  Discharge Instructions      Discharge Instructions    Diet Carb Modified    Complete by:  As directed      Increase activity slowly    Complete by:  As directed             Medication List    STOP taking these medications  LANTUS SOLOSTAR 100 UNIT/ML Solostar Pen  Generic drug:  Insulin Glargine  Replaced by:  insulin glargine 100 UNIT/ML injection      TAKE these medications        insulin aspart 100 UNIT/ML injection  Commonly known as:   novoLOG  Inject 5 Units into the skin 3 (three) times daily with meals. Sliding Scale:300 and above = 15 units, anything higher 25 units     insulin glargine 100 UNIT/ML injection  Commonly known as:  LANTUS  Inject 0.45 mLs (45 Units total) into the skin daily.  Start taking on:  08/12/2015         The results of significant diagnostics from this hospitalization (including imaging, microbiology, ancillary and laboratory) are listed below for reference.    Significant Diagnostic Studies: Dg Chest 2 View  08/07/2015  CLINICAL DATA:  Left-sided chest pain, shortness of breath for 1 week EXAM: CHEST  2 VIEW COMPARISON:  08/06/2015 FINDINGS: The heart size and mediastinal contours are within normal limits. Both lungs are clear. The visualized skeletal structures are unremarkable. IMPRESSION: No active cardiopulmonary disease. Electronically Signed   By: Elige Ko   On: 08/07/2015 18:42   Mr 3d Recon At Scanner  08/09/2015  CLINICAL DATA:  Elevated LFTs, mild dilatation of the common bile duct EXAM: MRI ABDOMEN WITHOUT AND WITH CONTRAST (INCLUDING MRCP) TECHNIQUE: Multiplanar multisequence MR imaging of the abdomen was performed both before and after the administration of intravenous contrast. Heavily T2-weighted images of the biliary and pancreatic ducts were obtained, and three-dimensional MRCP images were rendered by post processing. CONTRAST:  15mL MULTIHANCE GADOBENATE DIMEGLUMINE 529 MG/ML IV SOLN COMPARISON:  Right upper quadrant ultrasound dated 08/08/2015 FINDINGS: Lower chest:  Lung bases are clear. Hepatobiliary: Liver is within normal limits. No suspicious/enhancing hepatic lesions. No hepatic steatosis. Gallbladder is unremarkable. No intrahepatic or extrahepatic ductal dilatation. Common duct measures 6.5 mm, within the upper limits of normal. No choledocholithiasis is seen. Pancreas: Within normal limits. Spleen: Within normal limits. Adrenals/Urinary Tract: Adrenal glands are  within normal limits. Kidneys are within normal limits.  No hydronephrosis. Stomach/Bowel: Stomach is within normal limits. Visualized bowel is unremarkable. Vascular/Lymphatic: No evidence of abdominal aortic aneurysm. No suspicious abdominal lymphadenopathy. Other: No abdominal ascites. Musculoskeletal: No focal osseous lesions. IMPRESSION: No intrahepatic or extrahepatic ductal dilatation. Common duct is at the upper limits of normal. No choledocholithiasis is seen. Otherwise negative MRI abdomen. Electronically Signed   By: Charline Bills M.D.   On: 08/09/2015 17:17   Nm Hepato W/eject Fract  08/11/2015  CLINICAL DATA:  Three days of nausea vomiting and abdominal pain. EXAM: NUCLEAR MEDICINE HEPATOBILIARY IMAGING WITH GALLBLADDER EF TECHNIQUE: Sequential images of the abdomen were obtained out to 60 minutes following intravenous administration of radiopharmaceutical. After slow intravenous infusion of 1.45 micrograms Cholecystokinin, gallbladder ejection fraction was determined. RADIOPHARMACEUTICALS:  5.0 mCi Tc-65m Choletec IV COMPARISON:  MRI of 08/09/2015 FINDINGS: Prompt uptake of tracer by the liver. Gallbladder activity is seen at 5 minutes. Common duct activity is seen at 5-10 minutes. Bowel activity seen at 10 minutes. With CCK administration, no pain described. Ejection fraction estimated at 99%. At 45 min, normal ejection fraction is greater than 40%. IMPRESSION: 1. No evidence of acute cholecystitis or cystic duct obstruction. 2. Normal gallbladder ejection fraction, 99%. Electronically Signed   By: Jeronimo Greaves M.D.   On: 08/11/2015 13:26   Mr Abd W/wo Cm/mrcp  08/09/2015  CLINICAL DATA:  Elevated LFTs, mild dilatation of the common bile  duct EXAM: MRI ABDOMEN WITHOUT AND WITH CONTRAST (INCLUDING MRCP) TECHNIQUE: Multiplanar multisequence MR imaging of the abdomen was performed both before and after the administration of intravenous contrast. Heavily T2-weighted images of the biliary and  pancreatic ducts were obtained, and three-dimensional MRCP images were rendered by post processing. CONTRAST:  15mL MULTIHANCE GADOBENATE DIMEGLUMINE 529 MG/ML IV SOLN COMPARISON:  Right upper quadrant ultrasound dated 08/08/2015 FINDINGS: Lower chest:  Lung bases are clear. Hepatobiliary: Liver is within normal limits. No suspicious/enhancing hepatic lesions. No hepatic steatosis. Gallbladder is unremarkable. No intrahepatic or extrahepatic ductal dilatation. Common duct measures 6.5 mm, within the upper limits of normal. No choledocholithiasis is seen. Pancreas: Within normal limits. Spleen: Within normal limits. Adrenals/Urinary Tract: Adrenal glands are within normal limits. Kidneys are within normal limits.  No hydronephrosis. Stomach/Bowel: Stomach is within normal limits. Visualized bowel is unremarkable. Vascular/Lymphatic: No evidence of abdominal aortic aneurysm. No suspicious abdominal lymphadenopathy. Other: No abdominal ascites. Musculoskeletal: No focal osseous lesions. IMPRESSION: No intrahepatic or extrahepatic ductal dilatation. Common duct is at the upper limits of normal. No choledocholithiasis is seen. Otherwise negative MRI abdomen. Electronically Signed   By: Charline Bills M.D.   On: 08/09/2015 17:17   US Abdomen Limited Ruq  08/08/2015  CLINICAL DATA:  Abnormal liver enzymes EXAM: US ABDOMEN LIMITED - RIGHT UPPER QUADRANT COMPARISON:  None. FINDINGS: Gallbladder: No shadowing gallstones are noted within gallbladder. A gallbladder wall polyp measures 3 mm. No sonographic Murphy's sign. Borderline thickening of gallbladder wall up to 3 mm. Common bile duct: Diameter: 8.3 mm in diameter mild prominent in size Liver: No focal lesion identified. Within normal limits in parenchymal echogenicity. IMPRESSION: No shadowing gallstones are noted within gallbladder. There is 3 mm gallbladder wall polyp. CBD dilatation up to 8.3 mm. Borderline thickening of gallbladder wall up to 3 mm. No  sonographic Murphy's sign. Electronically Signed   By: Natasha Mead M.D.   On: 08/08/2015 11:29     Microbiology: Recent Results (from the past 240 hour(s))  MRSA PCR Screening     Status: Abnormal   Collection Time: 08/08/15  1:07 AM  Result Value Ref Range Status   MRSA by PCR POSITIVE (A) NEGATIVE Final    Comment:        The GeneXpert MRSA Assay (FDA approved for NASAL specimens only), is one component of a comprehensive MRSA colonization surveillance program. It is not intended to diagnose MRSA infection nor to guide or monitor treatment for MRSA infections. RESULT CALLED TO, READ BACK BY AND VERIFIED WITH: JUDY CORIA @0335  08/08/15 MKELLY      Labs: Basic Metabolic Panel:  Recent Labs Lab 08/07/15 2320 08/08/15 0528 08/09/15 0443 08/10/15 0328 08/11/15 0736  NA 135 136 135 136 131*  K 4.0 3.6 4.7 4.2 4.5  CL 100* 103 101 101 97*  CO2 20* 24 23 25 25   GLUCOSE 368* 112* 345* 303* 370*  BUN 14 12 15 16 15   CREATININE 1.22 0.90 0.83 0.84 0.81  CALCIUM 8.9 8.4* 8.8* 8.6* 8.7*   Liver Function Tests:  Recent Labs Lab 08/07/15 1839 08/08/15 0528 08/09/15 0443 08/10/15 0328 08/11/15 0736  AST 158* 83* 91* 108* 302*  ALT 105* 87* 87* 101* 220*  ALKPHOS 214* 164* 151* 148* 162*  BILITOT 1.4* 0.9 0.5 0.6 0.7  PROT 6.5 5.5* 5.5* 5.8* 6.1*  ALBUMIN 3.3* 3.2* 2.8* 3.1* 3.3*   No results for input(s): LIPASE, AMYLASE in the last 168 hours. No results for input(s): AMMONIA in the last  168 hours. CBC:  Recent Labs Lab 08/07/15 1839 08/09/15 0443  WBC 4.4 4.7  HGB 13.9 13.2  HCT 41.3 38.1*  MCV 92.8 89.9  PLT 177 168   Cardiac Enzymes: No results for input(s): CKTOTAL, CKMB, CKMBINDEX, TROPONINI in the last 168 hours. BNP: Invalid input(s): POCBNP CBG:  Recent Labs Lab 08/09/15 2128 08/10/15 0821 08/10/15 1206 08/10/15 1714 08/10/15 2138  GLUCAP 363* 299* 159* 469* 389*    Time coordinating discharge:  Greater than 30  minutes  Signed:  Aniko Finnigan, DO [facility] Pager: [phone] 08/11/2015, 2:14 PM

## 2015-08-12 LAB — CERULOPLASMIN: CERULOPLASMIN: 31.7 mg/dL — AB (ref 16.0–31.0)

## 2015-08-12 LAB — ANTI-SMOOTH MUSCLE ANTIBODY, IGG: F-ACTIN AB IGG: 5 U (ref 0–19)

## 2015-08-12 LAB — ALPHA-1-ANTITRYPSIN: A-1 Antitrypsin, Ser: 135 mg/dL (ref 90–200)

## 2015-08-13 LAB — ANTINUCLEAR ANTIBODIES, IFA: ANA Ab, IFA: NEGATIVE

## 2016-02-07 ENCOUNTER — Inpatient Hospital Stay (HOSPITAL_COMMUNITY)
Admission: AD | Admit: 2016-02-07 | Payer: Self-pay | Source: Other Acute Inpatient Hospital | Admitting: Internal Medicine

## 2016-07-16 DIAGNOSIS — E1065 Type 1 diabetes mellitus with hyperglycemia: Secondary | ICD-10-CM

## 2016-07-16 DIAGNOSIS — E1100 Type 2 diabetes mellitus with hyperosmolarity without nonketotic hyperglycemic-hyperosmolar coma (NKHHC): Secondary | ICD-10-CM

## 2016-07-16 DIAGNOSIS — Z720 Tobacco use: Secondary | ICD-10-CM

## 2016-07-16 DIAGNOSIS — E1143 Type 2 diabetes mellitus with diabetic autonomic (poly)neuropathy: Secondary | ICD-10-CM

## 2016-07-16 DIAGNOSIS — E109 Type 1 diabetes mellitus without complications: Secondary | ICD-10-CM

## 2016-07-16 DIAGNOSIS — R7989 Other specified abnormal findings of blood chemistry: Secondary | ICD-10-CM

## 2016-07-22 DIAGNOSIS — R109 Unspecified abdominal pain: Secondary | ICD-10-CM

## 2016-07-22 DIAGNOSIS — E1165 Type 2 diabetes mellitus with hyperglycemia: Secondary | ICD-10-CM

## 2016-07-22 DIAGNOSIS — E871 Hypo-osmolality and hyponatremia: Secondary | ICD-10-CM

## 2016-07-22 DIAGNOSIS — R112 Nausea with vomiting, unspecified: Secondary | ICD-10-CM

## 2016-07-22 DIAGNOSIS — R7989 Other specified abnormal findings of blood chemistry: Secondary | ICD-10-CM

## 2016-07-24 DIAGNOSIS — K219 Gastro-esophageal reflux disease without esophagitis: Secondary | ICD-10-CM

## 2016-07-24 DIAGNOSIS — N2889 Other specified disorders of kidney and ureter: Secondary | ICD-10-CM

## 2016-07-24 DIAGNOSIS — Z720 Tobacco use: Secondary | ICD-10-CM

## 2016-07-24 DIAGNOSIS — E1310 Other specified diabetes mellitus with ketoacidosis without coma: Secondary | ICD-10-CM

## 2016-07-24 DIAGNOSIS — R112 Nausea with vomiting, unspecified: Secondary | ICD-10-CM

## 2016-08-06 DIAGNOSIS — E871 Hypo-osmolality and hyponatremia: Secondary | ICD-10-CM

## 2016-08-06 DIAGNOSIS — R109 Unspecified abdominal pain: Secondary | ICD-10-CM

## 2016-08-06 DIAGNOSIS — R739 Hyperglycemia, unspecified: Secondary | ICD-10-CM

## 2016-08-06 DIAGNOSIS — Z9119 Patient's noncompliance with other medical treatment and regimen: Secondary | ICD-10-CM

## 2016-08-06 DIAGNOSIS — R7989 Other specified abnormal findings of blood chemistry: Secondary | ICD-10-CM

## 2016-08-06 DIAGNOSIS — F1210 Cannabis abuse, uncomplicated: Secondary | ICD-10-CM

## 2016-08-06 DIAGNOSIS — E1310 Other specified diabetes mellitus with ketoacidosis without coma: Secondary | ICD-10-CM

## 2016-08-06 DIAGNOSIS — R112 Nausea with vomiting, unspecified: Secondary | ICD-10-CM

## 2016-08-26 DIAGNOSIS — E873 Alkalosis: Secondary | ICD-10-CM

## 2016-08-26 DIAGNOSIS — B1920 Unspecified viral hepatitis C without hepatic coma: Secondary | ICD-10-CM

## 2016-08-26 DIAGNOSIS — I10 Essential (primary) hypertension: Secondary | ICD-10-CM

## 2016-08-26 DIAGNOSIS — E1065 Type 1 diabetes mellitus with hyperglycemia: Secondary | ICD-10-CM

## 2016-08-26 DIAGNOSIS — Z22322 Carrier or suspected carrier of Methicillin resistant Staphylococcus aureus: Secondary | ICD-10-CM

## 2016-08-26 DIAGNOSIS — R7989 Other specified abnormal findings of blood chemistry: Secondary | ICD-10-CM

## 2016-08-26 DIAGNOSIS — K219 Gastro-esophageal reflux disease without esophagitis: Secondary | ICD-10-CM

## 2016-08-26 DIAGNOSIS — E876 Hypokalemia: Secondary | ICD-10-CM

## 2016-08-26 DIAGNOSIS — L02413 Cutaneous abscess of right upper limb: Secondary | ICD-10-CM

## 2016-08-26 DIAGNOSIS — E1143 Type 2 diabetes mellitus with diabetic autonomic (poly)neuropathy: Secondary | ICD-10-CM

## 2016-08-26 DIAGNOSIS — R112 Nausea with vomiting, unspecified: Secondary | ICD-10-CM

## 2016-08-26 DIAGNOSIS — Z9114 Patient's other noncompliance with medication regimen: Secondary | ICD-10-CM

## 2016-08-26 DIAGNOSIS — E1142 Type 2 diabetes mellitus with diabetic polyneuropathy: Secondary | ICD-10-CM

## 2016-10-08 DIAGNOSIS — E1069 Type 1 diabetes mellitus with other specified complication: Secondary | ICD-10-CM

## 2016-10-08 DIAGNOSIS — I10 Essential (primary) hypertension: Secondary | ICD-10-CM

## 2016-10-08 DIAGNOSIS — B1920 Unspecified viral hepatitis C without hepatic coma: Secondary | ICD-10-CM

## 2016-10-08 DIAGNOSIS — E785 Hyperlipidemia, unspecified: Secondary | ICD-10-CM

## 2016-10-08 DIAGNOSIS — E875 Hyperkalemia: Secondary | ICD-10-CM

## 2016-12-01 DIAGNOSIS — J449 Chronic obstructive pulmonary disease, unspecified: Secondary | ICD-10-CM

## 2016-12-01 DIAGNOSIS — B1920 Unspecified viral hepatitis C without hepatic coma: Secondary | ICD-10-CM

## 2016-12-01 DIAGNOSIS — E1143 Type 2 diabetes mellitus with diabetic autonomic (poly)neuropathy: Secondary | ICD-10-CM

## 2016-12-01 DIAGNOSIS — E1142 Type 2 diabetes mellitus with diabetic polyneuropathy: Secondary | ICD-10-CM

## 2016-12-01 DIAGNOSIS — R109 Unspecified abdominal pain: Secondary | ICD-10-CM

## 2016-12-01 DIAGNOSIS — E871 Hypo-osmolality and hyponatremia: Secondary | ICD-10-CM

## 2016-12-01 DIAGNOSIS — E1069 Type 1 diabetes mellitus with other specified complication: Secondary | ICD-10-CM

## 2016-12-01 DIAGNOSIS — R112 Nausea with vomiting, unspecified: Secondary | ICD-10-CM

## 2016-12-03 DIAGNOSIS — E1142 Type 2 diabetes mellitus with diabetic polyneuropathy: Secondary | ICD-10-CM

## 2016-12-03 DIAGNOSIS — R112 Nausea with vomiting, unspecified: Secondary | ICD-10-CM

## 2016-12-03 DIAGNOSIS — B1920 Unspecified viral hepatitis C without hepatic coma: Secondary | ICD-10-CM

## 2016-12-03 DIAGNOSIS — J449 Chronic obstructive pulmonary disease, unspecified: Secondary | ICD-10-CM

## 2016-12-03 DIAGNOSIS — E1069 Type 1 diabetes mellitus with other specified complication: Secondary | ICD-10-CM

## 2016-12-03 DIAGNOSIS — E871 Hypo-osmolality and hyponatremia: Secondary | ICD-10-CM

## 2016-12-03 DIAGNOSIS — R109 Unspecified abdominal pain: Secondary | ICD-10-CM

## 2017-03-23 IMAGING — NM NM HEPATO W/GB/PHARM/[PERSON_NAME]
3 series · 13 of 13 positions shown · non-contrast
Comparison: MRI of 08/09/2015

CLINICAL DATA: Three days of nausea vomiting and abdominal pain.

EXAM:
NUCLEAR MEDICINE HEPATOBILIARY IMAGING WITH GALLBLADDER EF
TECHNIQUE: Sequential images of the abdomen were obtained [DATE] minutes
following intravenous administration of radiopharmaceutical. After
slow intravenous infusion of 1.45 micrograms Cholecystokinin,
gallbladder ejection fraction was determined.
RADIOPHARMACEUTICALS:  5.0 mCi Pc-PPm Choletec IV

[Series 1: biliary · 4.14mm/px · 6 of 60 frames shown]
[frame 6/60]
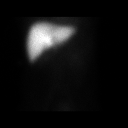
[frame 16/60]
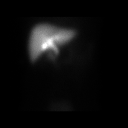
[frame 26/60]
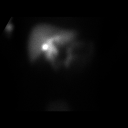
[frame 36/60]
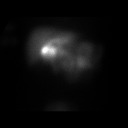
[frame 46/60]
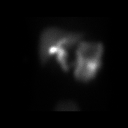
[frame 56/60]
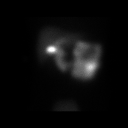

[Series 2: rt lat · 4.14mm/px · 1 of 1 slices shown]
[im 1/1]
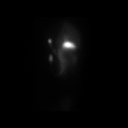

[Series 3: gbef · 4.14mm/px · 6 of 60 frames shown]
[frame 6/60]
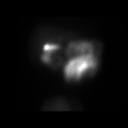
[frame 16/60]
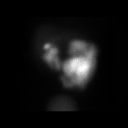
[frame 26/60]
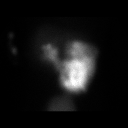
[frame 36/60]
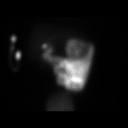
[frame 46/60]
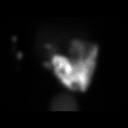
[frame 56/60]
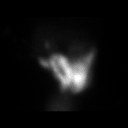

[13 of 13 positions shown; findings below may reference images not displayed]

FINDINGS: Prompt uptake of tracer by the liver. Gallbladder activity is seen
at 5 minutes. Common duct activity is seen at 5-10 minutes. Bowel
activity seen at 10 minutes. With CCK administration, no pain
described. Ejection fraction estimated at 99%. At 45 min, normal
ejection fraction is greater than 40%.
IMPRESSION: 1. No evidence of acute cholecystitis or cystic duct obstruction.
2. Normal gallbladder ejection fraction, 99%.

## 2017-09-24 DIAGNOSIS — E1065 Type 1 diabetes mellitus with hyperglycemia: Secondary | ICD-10-CM

## 2017-09-24 DIAGNOSIS — R109 Unspecified abdominal pain: Secondary | ICD-10-CM

## 2017-09-24 DIAGNOSIS — E1121 Type 2 diabetes mellitus with diabetic nephropathy: Secondary | ICD-10-CM

## 2017-10-26 DIAGNOSIS — Z720 Tobacco use: Secondary | ICD-10-CM

## 2017-10-26 DIAGNOSIS — E119 Type 2 diabetes mellitus without complications: Secondary | ICD-10-CM

## 2017-10-26 DIAGNOSIS — R112 Nausea with vomiting, unspecified: Secondary | ICD-10-CM

## 2017-10-26 DIAGNOSIS — K5900 Constipation, unspecified: Secondary | ICD-10-CM

## 2017-11-23 DIAGNOSIS — E1143 Type 2 diabetes mellitus with diabetic autonomic (poly)neuropathy: Secondary | ICD-10-CM

## 2019-01-05 ENCOUNTER — Encounter: Payer: Self-pay | Admitting: Internal Medicine

## 2019-03-03 ENCOUNTER — Other Ambulatory Visit: Payer: Self-pay

## 2019-03-03 ENCOUNTER — Ambulatory Visit: Payer: Medicaid Other | Admitting: Endocrinology

## 2019-03-03 ENCOUNTER — Encounter: Payer: Self-pay | Admitting: Endocrinology

## 2019-03-03 VITALS — BP 150/70 | HR 94 | Ht 68.0 in | Wt 152.6 lb

## 2019-03-03 DIAGNOSIS — F17200 Nicotine dependence, unspecified, uncomplicated: Secondary | ICD-10-CM

## 2019-03-03 DIAGNOSIS — E1010 Type 1 diabetes mellitus with ketoacidosis without coma: Secondary | ICD-10-CM

## 2019-03-03 DIAGNOSIS — E1043 Type 1 diabetes mellitus with diabetic autonomic (poly)neuropathy: Secondary | ICD-10-CM

## 2019-03-03 DIAGNOSIS — E119 Type 2 diabetes mellitus without complications: Secondary | ICD-10-CM | POA: Insufficient documentation

## 2019-03-03 LAB — POCT GLYCOSYLATED HEMOGLOBIN (HGB A1C): Hemoglobin A1C: 8.6 % — AB (ref 4.0–5.6)

## 2019-03-03 NOTE — Progress Notes (Signed)
Subjective:    Patient ID: Marco Beasley, male    DOB: 1981-03-28, 38 y.o.   MRN: [id number]  HPI pt is referred by Dr Nelson Chimes, for diabetes.  Pt states DM was dx'ed in 1995; he has severe neuropathy of the lower extremities; he has associated gastroparesis and PDR; he has been on insulin since dx; pt says his diet and exercise are fair; he has never had pancreatitis, pancreatic surgery.  Last episode of severe hypoglycemia was in 2018.  He has had several episodes of DKA (when he misses insulin).  Last episode was in 2019. He lives at a homeless shelter.  A service called "[employer]" pays for insulins.  He takes basaglar 45/d, and PRN novolog (averages a total of approx 7 units/day).  He says cbg varies from 68-300's.  There is little trend throughout the day.  It is lowest with vomiting.   Past Medical History:  Diagnosis Date  . COPD (chronic obstructive pulmonary disease) (HCC)   . Diabetes mellitus without complication (HCC)   . Gastroparesis   . Hypertension     No past surgical history on file.  Social History   Socioeconomic History  . Marital status: Married    Spouse name: Not on file  . Number of children: Not on file  . Years of education: Not on file  . Highest education level: Not on file  Occupational History  . Not on file  Social Needs  . Financial resource strain: Not on file  . Food insecurity:    Worry: Not on file    Inability: Not on file  . Transportation needs:    Medical: Not on file    Non-medical: Not on file  Tobacco Use  . Smoking status: Current Every Day Smoker  . Smokeless tobacco: Never Used  Substance and Sexual Activity  . Alcohol use: No  . Drug use: No  . Sexual activity: Not Currently  Lifestyle  . Physical activity:    Days per week: Not on file    Minutes per session: Not on file  . Stress: Not on file  Relationships  . Social connections:    Talks on phone: Not on file    Gets together: Not on file    Attends religious  service: Not on file    Active member of club or organization: Not on file    Attends meetings of clubs or organizations: Not on file    Relationship status: Not on file  . Intimate partner violence:    Fear of current or ex partner: Not on file    Emotionally abused: Not on file    Physically abused: Not on file    Forced sexual activity: Not on file  Other Topics Concern  . Not on file  Social History Narrative  . Not on file    Current Outpatient Medications on File Prior to Visit  Medication Sig Dispense Refill  . insulin aspart (NOVOLOG) 100 UNIT/ML injection Inject 5 Units into the skin 3 (three) times daily with meals. Sliding Scale:300 and above = 15 units, anything higher 25 units    . insulin glargine (LANTUS) 100 UNIT/ML injection Inject 0.45 mLs (45 Units total) into the skin daily. 10 mL 0   No current facility-administered medications on file prior to visit.      Family History  Problem Relation Age of Onset  . Diabetes Neg Hx     BP (!) 150/70 (BP Location: Left Arm, Patient Position:  Sitting, Cuff Size: Normal)   Pulse 94   Ht 5\' 8"  (1.727 m)   Wt 152 lb 9.6 oz (69.2 kg)   SpO2 94%   BMI 23.20 kg/m    Review of Systems denies weight loss, headache, chest pain, sob, urinary frequency, excessive diaphoresis, cold intolerance, rhinorrhea, and easy bruising. He has chronic n/v, visual loss, depression, difficulty with concentration, muscle cramps, and fatigue.     Objective:   Physical Exam VS: see vs page GEN: no distress HEAD: head: no deformity.  (chronic) deformity of the right ext ear. eyes: no periorbital swelling, no proptosis external nose and ears are normal mouth: no lesion seen NECK: supple, thyroid is not enlarged CHEST WALL: no deformity LUNGS: clear to auscultation CV: reg rate and rhythm, no murmur ABD: abdomen is soft, nontender.  no hepatosplenomegaly.  not distended.  no hernia MUSCULOSKELETAL: muscle bulk and strength are grossly  normal.  no obvious joint swelling.  gait is normal and steady.   EXTEMITIES: no deformity.  no ulcer on the feet.  feet are of normal color and temp.  no edema.  Heavy calluses on both feet.  PULSES: dorsalis pedis intact bilat.  no carotid bruit NEURO:  cn 2-12 grossly intact.   readily moves all 4's.  sensation is intact to touch on the feet, but decreased from normal.   SKIN:  Normal texture and temperature.  No rash or suspicious lesion is visible.  Healed ulcera t the dorsal aspect of the right foot.  NODES:  None palpable at the neck PSYCH: alert, well-oriented.  Does not appear anxious nor depressed.     Lab Results  Component Value Date   HGBA1C 8.6 (A) 03/03/2019   I have reviewed outside records, and summarized: Pt was noted to have elevated a1c, and referred here.  Pt was also noted to be a smoker.     Assessment & Plan:  Type 1 DM, with PDR: he needs increased rx Gastroparesis: improvement in this is needed in order to improve glycemic control poor social situation: this also limits rx of DM Painful PN: this is outside the scope of my practice.  However, I told pt that he should consider having PNCV.  This might help him qualify for disability.   Patient Instructions  good diet and exercise significantly improve the control of your diabetes.  please let me know if you wish to be referred to a dietician.  high blood sugar is very risky to your health.  you should see an eye doctor and dentist every year.  It is very important to get all recommended vaccinations.  Controlling your blood pressure and cholesterol drastically reduces the damage diabetes does to your body.  Those who smoke should quit.  Please discuss these with your doctor.   check your blood sugar once a day.  vary the time of day when you check, between before the 3 meals, and at bedtime.  also check if you have symptoms of your blood sugar being too high or too low.  please keep a record of the readings and bring  it to your next appointment here (or you can bring the meter itself).  You can write it on any piece of paper.  please call us sooner if your blood sugar goes below 70, or if you have a lot of readings over 200. Please go back to see the gastroenterology specialist.  you will receive a phone call, about a day and time for an appointment.  Controlling the nausea is necessary to control the blood sugar. A neurologist could test the nerve endings in the legs.  This is you best chance of getting disability, if you qualify.  Please continue the same insulins for now.   Please come back for a follow-up appointment in 3 months.

## 2019-03-03 NOTE — Patient Instructions (Addendum)
good diet and exercise significantly improve the control of your diabetes.  please let me know if you wish to be referred to a dietician.  high blood sugar is very risky to your health.  you should see an eye doctor and dentist every year.  It is very important to get all recommended vaccinations.  Controlling your blood pressure and cholesterol drastically reduces the damage diabetes does to your body.  Those who smoke should quit.  Please discuss these with your doctor.   check your blood sugar once a day.  vary the time of day when you check, between before the 3 meals, and at bedtime.  also check if you have symptoms of your blood sugar being too high or too low.  please keep a record of the readings and bring it to your next appointment here (or you can bring the meter itself).  You can write it on any piece of paper.  please call us sooner if your blood sugar goes below 70, or if you have a lot of readings over 200. Please go back to see the gastroenterology specialist.  you will receive a phone call, about a day and time for an appointment.  Controlling the nausea is necessary to control the blood sugar. A neurologist could test the nerve endings in the legs.  This is you best chance of getting disability, if you qualify.  Please continue the same insulins for now.   Please come back for a follow-up appointment in 3 months.

## 2019-05-28 DEATH — deceased
